# Patient Record
Sex: Male | Born: 1945
Health system: Southern US, Community
[De-identification: ages and names within clinical notes are randomized; demographics above are authoritative.]

## PROBLEM LIST (undated history)

## (undated) HISTORY — PX: CERVICAL DISCECTOMY: SHX98

---

## 1966-01-24 HISTORY — PX: NASAL SEPTUM SURGERY: SHX37

## 1999-04-29 ENCOUNTER — Ambulatory Visit (HOSPITAL_COMMUNITY): Admission: RE | Admit: 1999-04-29 | Discharge: 1999-04-29 | Payer: Self-pay | Admitting: Neurosurgery

## 1999-04-29 ENCOUNTER — Encounter: Payer: Self-pay | Admitting: Neurosurgery

## 1999-05-28 ENCOUNTER — Encounter: Payer: Self-pay | Admitting: Neurosurgery

## 1999-05-28 ENCOUNTER — Ambulatory Visit (HOSPITAL_COMMUNITY): Admission: RE | Admit: 1999-05-28 | Discharge: 1999-05-28 | Payer: Self-pay | Admitting: Neurosurgery

## 1999-07-20 ENCOUNTER — Encounter: Payer: Self-pay | Admitting: Neurosurgery

## 1999-07-20 ENCOUNTER — Ambulatory Visit (HOSPITAL_COMMUNITY): Admission: RE | Admit: 1999-07-20 | Discharge: 1999-07-20 | Payer: Self-pay | Admitting: Neurosurgery

## 2001-05-17 ENCOUNTER — Encounter: Payer: Self-pay | Admitting: Neurosurgery

## 2001-05-17 ENCOUNTER — Encounter: Admission: RE | Admit: 2001-05-17 | Discharge: 2001-05-17 | Payer: Self-pay | Admitting: Neurosurgery

## 2001-05-31 ENCOUNTER — Encounter: Payer: Self-pay | Admitting: Neurosurgery

## 2001-05-31 ENCOUNTER — Encounter: Admission: RE | Admit: 2001-05-31 | Discharge: 2001-05-31 | Payer: Self-pay | Admitting: Neurosurgery

## 2001-06-25 ENCOUNTER — Encounter: Payer: Self-pay | Admitting: Neurosurgery

## 2001-06-25 ENCOUNTER — Encounter: Admission: RE | Admit: 2001-06-25 | Discharge: 2001-06-25 | Payer: Self-pay | Admitting: Neurosurgery

## 2001-09-21 ENCOUNTER — Encounter: Admission: RE | Admit: 2001-09-21 | Discharge: 2001-09-21 | Payer: Self-pay | Admitting: Neurosurgery

## 2001-09-21 ENCOUNTER — Encounter: Payer: Self-pay | Admitting: Neurosurgery

## 2002-04-16 ENCOUNTER — Encounter: Payer: Self-pay | Admitting: Neurosurgery

## 2002-04-17 ENCOUNTER — Inpatient Hospital Stay (HOSPITAL_COMMUNITY): Admission: RE | Admit: 2002-04-17 | Discharge: 2002-04-18 | Payer: Self-pay | Admitting: Neurosurgery

## 2002-04-17 ENCOUNTER — Encounter: Payer: Self-pay | Admitting: Neurosurgery

## 2003-04-17 ENCOUNTER — Ambulatory Visit (HOSPITAL_COMMUNITY): Admission: RE | Admit: 2003-04-17 | Discharge: 2003-04-17 | Payer: Self-pay | Admitting: Gastroenterology

## 2003-10-02 ENCOUNTER — Ambulatory Visit (HOSPITAL_COMMUNITY): Admission: RE | Admit: 2003-10-02 | Discharge: 2003-10-02 | Payer: Self-pay | Admitting: Neurosurgery

## 2011-06-13 ENCOUNTER — Encounter: Payer: Self-pay | Admitting: Gastroenterology

## 2011-06-30 ENCOUNTER — Ambulatory Visit (AMBULATORY_SURGERY_CENTER): Payer: BC Managed Care – PPO | Admitting: *Deleted

## 2011-06-30 ENCOUNTER — Encounter: Payer: Self-pay | Admitting: Gastroenterology

## 2011-06-30 VITALS — Ht 72.0 in | Wt 182.0 lb

## 2011-06-30 DIAGNOSIS — Z1211 Encounter for screening for malignant neoplasm of colon: Secondary | ICD-10-CM

## 2011-06-30 MED ORDER — PEG-KCL-NACL-NASULF-NA ASC-C 100 G PO SOLR: ORAL | Status: DC

## 2011-07-12 ENCOUNTER — Telehealth: Payer: Self-pay | Admitting: Gastroenterology

## 2011-07-12 NOTE — Telephone Encounter (Signed)
yes

## 2011-07-14 ENCOUNTER — Encounter: Payer: Self-pay | Admitting: Gastroenterology

## 2011-08-05 ENCOUNTER — Telehealth: Payer: Self-pay | Admitting: Gastroenterology

## 2011-08-09 ENCOUNTER — Encounter: Payer: BC Managed Care – PPO | Admitting: Gastroenterology

## 2011-08-30 ENCOUNTER — Ambulatory Visit (AMBULATORY_SURGERY_CENTER): Payer: BC Managed Care – PPO | Admitting: Gastroenterology

## 2011-08-30 ENCOUNTER — Encounter: Payer: Self-pay | Admitting: Gastroenterology

## 2011-08-30 VITALS — BP 135/72 | HR 62 | Temp 97.2°F | Resp 17 | Ht 72.0 in | Wt 182.0 lb

## 2011-08-30 DIAGNOSIS — Z1211 Encounter for screening for malignant neoplasm of colon: Secondary | ICD-10-CM

## 2011-08-30 DIAGNOSIS — Z86010 Personal history of colonic polyps: Secondary | ICD-10-CM

## 2011-08-30 DIAGNOSIS — R198 Other specified symptoms and signs involving the digestive system and abdomen: Secondary | ICD-10-CM

## 2011-08-30 DIAGNOSIS — D126 Benign neoplasm of colon, unspecified: Secondary | ICD-10-CM

## 2011-08-30 DIAGNOSIS — K6289 Other specified diseases of anus and rectum: Secondary | ICD-10-CM

## 2011-08-30 MED ORDER — SODIUM CHLORIDE 0.9 % IV SOLN: 500.0000 mL | INTRAVENOUS | Status: DC

## 2011-08-30 NOTE — Patient Instructions (Addendum)
YOU HAD AN ENDOSCOPIC PROCEDURE TODAY AT THE  ENDOSCOPY CENTER: Refer to the procedure report that was given to you for any specific questions about what was found during the examination.  If the procedure report does not answer your questions, please call your gastroenterologist to clarify.  If you requested that your care partner not be given the details of your procedure findings, then the procedure report has been included in a sealed envelope for you to review at your convenience later.  YOU SHOULD EXPECT: Some feelings of bloating in the abdomen. Passage of more gas than usual.  Walking can help get rid of the air that was put into your GI tract during the procedure and reduce the bloating. If you had a lower endoscopy (such as a colonoscopy or flexible sigmoidoscopy) you may notice spotting of blood in your stool or on the toilet paper. If you underwent a bowel prep for your procedure, then you may not have a normal bowel movement for a few days.  DIET: Your first meal following the procedure should be a light meal and then it is ok to progress to your normal diet.  A half-sandwich or bowl of soup is an example of a good first meal.  Heavy or fried foods are harder to digest and may make you feel nauseous or bloated.  Likewise meals heavy in dairy and vegetables can cause extra gas to form and this can also increase the bloating.  Drink plenty of fluids but you should avoid alcoholic beverages for 24 hours.  ACTIVITY: Your care partner should take you home directly after the procedure.  You should plan to take it easy, moving slowly for the rest of the day.  You can resume normal activity the day after the procedure however you should NOT DRIVE or use heavy machinery for 24 hours (because of the sedation medicines used during the test).    SYMPTOMS TO REPORT IMMEDIATELY: A gastroenterologist can be reached at any hour.  During normal business hours, 8:30 AM to 5:00 PM Monday through Friday,  call [phone].  After hours and on weekends, please call the GI answering service at [phone] who will take a message and have the physician on call contact you.   Following lower endoscopy (colonoscopy or flexible sigmoidoscopy):  Excessive amounts of blood in the stool  Significant tenderness or worsening of abdominal pains  Swelling of the abdomen that is new, acute  Fever of 100F or higher  FOLLOW UP: If any biopsies were taken you will be contacted by phone or by letter within the next 1-3 weeks.  Call your gastroenterologist if you have not heard about the biopsies in 3 weeks.  Our staff will call the home number listed on your records the next business day following your procedure to check on you and address any questions or concerns that you may have at that time regarding the information given to you following your procedure. This is a courtesy call and so if there is no answer at the home number and we have not heard from you through the emergency physician on call, we will assume that you have returned to your regular daily activities without incident.  SIGNATURES/CONFIDENTIALITY: You and/or your care partner have signed paperwork which will be entered into your electronic medical record.  These signatures attest to the fact that that the information above on your After Visit Summary has been reviewed and is understood.  Full responsibility of the confidentiality of this   discharge information lies with you and/or your care-partner.   Resume medications. Information given on polyps,diverticulosis,high fiber diet and hemorrhoids with discharge instructions. 

## 2011-08-30 NOTE — Progress Notes (Signed)
Patient did not experience any of the following events: a burn prior to discharge; a fall within the facility; wrong site/side/patient/procedure/implant event; or a hospital transfer or hospital admission upon discharge from the facility. (G8907) Patient did not have preoperative order for IV antibiotic SSI prophylaxis. (G8918)  

## 2011-08-30 NOTE — Op Note (Signed)
[facility] [address] [place], [place]  [address]  COLONOSCOPY PROCEDURE REPORT  PATIENT:  Joseph Moses, Joseph Moses  MR#:  [id number] BIRTHDATE:  10/10/1945, 65 yrs. old  GENDER:  male ENDOSCOPIST:  Barbette Hair. Arlyce Dice, MD REF. BY:  Catha Gosselin, M.D. PROCEDURE DATE:  08/30/2011 PROCEDURE:  Colon with cold biopsy polypectomy, Colonoscopy with biopsy ASA CLASS:  Class I INDICATIONS:  Screening, history of pre-cancerous (adenomatous) colon polyps index polypectomy 2005 MEDICATIONS:   MAC sedation, administered by CRNA propofol 250mg IV  DESCRIPTION OF PROCEDURE:   After the risks benefits and alternatives of the procedure were thoroughly explained, informed consent was obtained.  Digital rectal exam was performed and revealed no abnormalities.   The LB CF-[id number] endoscope was introduced through the anus and advanced to the cecum, which was identified by both the appendix and ileocecal valve, without limitations.  The quality of the prep was excellent, using MoviPrep.  The instrument was then slowly withdrawn as the colon was fully examined. <<PROCEDUREIMAGES>>  FINDINGS:  A sessile polyp was found in the rectum. 3-4cm area of prominent mucosa within 2cm of anus, soft, corresponding to previously described area at 2005 colonoscopy. Bxs taken (see image5).  A sessile polyp was found in the rectum. It was 2 mm in size. It was found 2 cm from the point of entry. The polyp was removed using cold biopsy forceps (see image6).  Mild diverticulosis was found in the sigmoid colon (see image1). Internal Hemorrhoids were found (see image4).  This was otherwise a normal examination of the colon (see image2).   Retroflexed views in the rectum revealed no abnormalities.    The time to cecum = 1) 4.25  minutes. The scope was then withdrawn in  1) 9.0  minutes from the cecum and the procedure completed. COMPLICATIONS:  None ENDOSCOPIC IMPRESSION: 1) Sessile polyp in the rectum 2) 2 mm  sessile polyp in the rectum 3) Mild diverticulosis in the sigmoid colon 4) Internal hemorrhoids 5) Otherwise normal examination RECOMMENDATIONS: 1) Await biopsy results REPEAT EXAM:  You will receive a letter from Dr. Arlyce Dice in 1-2 weeks, after reviewing the final pathology, with followup recommendations.  ______________________________ Barbette Hair Arlyce Dice, MD  CC:  n. eSIGNED:   Barbette Hair. Reign Bartnick at 08/30/2011 03:26 PM  Gaylyn Cheers, [phone]

## 2011-08-31 ENCOUNTER — Telehealth: Payer: Self-pay | Admitting: *Deleted

## 2011-08-31 NOTE — Telephone Encounter (Signed)
Left message that we called for f/u 

## 2011-09-01 ENCOUNTER — Encounter: Payer: Self-pay | Admitting: Gastroenterology

## 2011-09-09 NOTE — Telephone Encounter (Signed)
Austin Miles, Site Mgr, notified.

## 2013-01-31 ENCOUNTER — Ambulatory Visit
Admission: RE | Admit: 2013-01-31 | Discharge: 2013-01-31 | Disposition: A | Discharge status: Home / Self Care | Payer: Medicare Other | Source: Ambulatory Visit | Attending: Family Medicine | Admitting: Family Medicine

## 2013-01-31 ENCOUNTER — Other Ambulatory Visit: Payer: Self-pay | Admitting: Family Medicine

## 2013-01-31 DIAGNOSIS — M542 Cervicalgia: Secondary | ICD-10-CM

## 2013-04-23 ENCOUNTER — Other Ambulatory Visit: Payer: Self-pay | Admitting: Student

## 2013-04-23 ENCOUNTER — Other Ambulatory Visit: Payer: Self-pay | Admitting: Family Medicine

## 2013-04-23 DIAGNOSIS — Z8271 Family history of polycystic kidney: Secondary | ICD-10-CM

## 2013-05-01 ENCOUNTER — Ambulatory Visit
Admission: RE | Admit: 2013-05-01 | Discharge: 2013-05-01 | Disposition: A | Discharge status: Home / Self Care | Payer: Medicare Other | Source: Ambulatory Visit | Attending: Family Medicine | Admitting: Family Medicine

## 2013-05-01 DIAGNOSIS — Z8271 Family history of polycystic kidney: Secondary | ICD-10-CM

## 2014-02-04 DIAGNOSIS — G4733 Obstructive sleep apnea (adult) (pediatric): Secondary | ICD-10-CM | POA: Diagnosis not present

## 2014-03-13 DIAGNOSIS — H6123 Impacted cerumen, bilateral: Secondary | ICD-10-CM | POA: Diagnosis not present

## 2014-03-13 DIAGNOSIS — D126 Benign neoplasm of colon, unspecified: Secondary | ICD-10-CM | POA: Diagnosis not present

## 2014-03-13 DIAGNOSIS — Z0000 Encounter for general adult medical examination without abnormal findings: Secondary | ICD-10-CM | POA: Diagnosis not present

## 2014-03-13 DIAGNOSIS — Z79899 Other long term (current) drug therapy: Secondary | ICD-10-CM | POA: Diagnosis not present

## 2014-03-13 DIAGNOSIS — M479 Spondylosis, unspecified: Secondary | ICD-10-CM | POA: Diagnosis not present

## 2014-03-13 DIAGNOSIS — G4733 Obstructive sleep apnea (adult) (pediatric): Secondary | ICD-10-CM | POA: Diagnosis not present

## 2014-03-13 DIAGNOSIS — N400 Enlarged prostate without lower urinary tract symptoms: Secondary | ICD-10-CM | POA: Diagnosis not present

## 2014-03-13 DIAGNOSIS — Z136 Encounter for screening for cardiovascular disorders: Secondary | ICD-10-CM | POA: Diagnosis not present

## 2014-03-13 DIAGNOSIS — Z125 Encounter for screening for malignant neoplasm of prostate: Secondary | ICD-10-CM | POA: Diagnosis not present

## 2014-03-13 DIAGNOSIS — M1990 Unspecified osteoarthritis, unspecified site: Secondary | ICD-10-CM | POA: Diagnosis not present

## 2014-10-24 DIAGNOSIS — Z23 Encounter for immunization: Secondary | ICD-10-CM | POA: Diagnosis not present

## 2015-03-24 DIAGNOSIS — Z0000 Encounter for general adult medical examination without abnormal findings: Secondary | ICD-10-CM | POA: Diagnosis not present

## 2015-03-24 DIAGNOSIS — Z136 Encounter for screening for cardiovascular disorders: Secondary | ICD-10-CM | POA: Diagnosis not present

## 2015-03-24 DIAGNOSIS — Z125 Encounter for screening for malignant neoplasm of prostate: Secondary | ICD-10-CM | POA: Diagnosis not present

## 2015-03-26 DIAGNOSIS — D126 Benign neoplasm of colon, unspecified: Secondary | ICD-10-CM | POA: Diagnosis not present

## 2015-03-26 DIAGNOSIS — H6120 Impacted cerumen, unspecified ear: Secondary | ICD-10-CM | POA: Diagnosis not present

## 2015-03-26 DIAGNOSIS — G4733 Obstructive sleep apnea (adult) (pediatric): Secondary | ICD-10-CM | POA: Diagnosis not present

## 2015-03-26 DIAGNOSIS — Z79899 Other long term (current) drug therapy: Secondary | ICD-10-CM | POA: Diagnosis not present

## 2015-03-26 DIAGNOSIS — Z125 Encounter for screening for malignant neoplasm of prostate: Secondary | ICD-10-CM | POA: Diagnosis not present

## 2015-03-26 DIAGNOSIS — Z0000 Encounter for general adult medical examination without abnormal findings: Secondary | ICD-10-CM | POA: Diagnosis not present

## 2015-10-21 DIAGNOSIS — Z23 Encounter for immunization: Secondary | ICD-10-CM | POA: Diagnosis not present

## 2017-02-01 DIAGNOSIS — Z0100 Encounter for examination of eyes and vision without abnormal findings: Secondary | ICD-10-CM | POA: Diagnosis not present

## 2017-02-28 DIAGNOSIS — R69 Illness, unspecified: Secondary | ICD-10-CM | POA: Diagnosis not present

## 2017-03-22 DIAGNOSIS — H25811 Combined forms of age-related cataract, right eye: Secondary | ICD-10-CM | POA: Diagnosis not present

## 2017-03-22 DIAGNOSIS — H2511 Age-related nuclear cataract, right eye: Secondary | ICD-10-CM | POA: Diagnosis not present

## 2017-04-25 DIAGNOSIS — Z125 Encounter for screening for malignant neoplasm of prostate: Secondary | ICD-10-CM | POA: Diagnosis not present

## 2017-04-25 DIAGNOSIS — Z79899 Other long term (current) drug therapy: Secondary | ICD-10-CM | POA: Diagnosis not present

## 2017-04-26 DIAGNOSIS — R7301 Impaired fasting glucose: Secondary | ICD-10-CM | POA: Diagnosis not present

## 2017-04-26 DIAGNOSIS — R972 Elevated prostate specific antigen [PSA]: Secondary | ICD-10-CM | POA: Diagnosis not present

## 2017-04-26 DIAGNOSIS — Z7189 Other specified counseling: Secondary | ICD-10-CM | POA: Diagnosis not present

## 2017-04-26 DIAGNOSIS — Z1389 Encounter for screening for other disorder: Secondary | ICD-10-CM | POA: Diagnosis not present

## 2017-04-26 DIAGNOSIS — G4733 Obstructive sleep apnea (adult) (pediatric): Secondary | ICD-10-CM | POA: Diagnosis not present

## 2017-04-26 DIAGNOSIS — Z79899 Other long term (current) drug therapy: Secondary | ICD-10-CM | POA: Diagnosis not present

## 2017-04-26 DIAGNOSIS — Z86010 Personal history of colonic polyps: Secondary | ICD-10-CM | POA: Diagnosis not present

## 2017-04-26 DIAGNOSIS — M479 Spondylosis, unspecified: Secondary | ICD-10-CM | POA: Diagnosis not present

## 2017-04-26 DIAGNOSIS — Z0000 Encounter for general adult medical examination without abnormal findings: Secondary | ICD-10-CM | POA: Diagnosis not present

## 2017-09-28 DIAGNOSIS — R972 Elevated prostate specific antigen [PSA]: Secondary | ICD-10-CM | POA: Diagnosis not present

## 2017-09-29 DIAGNOSIS — R972 Elevated prostate specific antigen [PSA]: Secondary | ICD-10-CM | POA: Diagnosis not present

## 2017-10-16 DIAGNOSIS — R69 Illness, unspecified: Secondary | ICD-10-CM | POA: Diagnosis not present

## 2017-10-26 DIAGNOSIS — R972 Elevated prostate specific antigen [PSA]: Secondary | ICD-10-CM | POA: Diagnosis not present

## 2017-11-09 DIAGNOSIS — Z961 Presence of intraocular lens: Secondary | ICD-10-CM | POA: Diagnosis not present

## 2018-06-07 DIAGNOSIS — Z79899 Other long term (current) drug therapy: Secondary | ICD-10-CM | POA: Diagnosis not present

## 2018-06-07 DIAGNOSIS — M1990 Unspecified osteoarthritis, unspecified site: Secondary | ICD-10-CM | POA: Diagnosis not present

## 2018-06-07 DIAGNOSIS — Z86010 Personal history of colonic polyps: Secondary | ICD-10-CM | POA: Diagnosis not present

## 2018-06-07 DIAGNOSIS — R69 Illness, unspecified: Secondary | ICD-10-CM | POA: Diagnosis not present

## 2018-06-07 DIAGNOSIS — G4733 Obstructive sleep apnea (adult) (pediatric): Secondary | ICD-10-CM | POA: Diagnosis not present

## 2018-06-07 DIAGNOSIS — Z0000 Encounter for general adult medical examination without abnormal findings: Secondary | ICD-10-CM | POA: Diagnosis not present

## 2018-06-07 DIAGNOSIS — E785 Hyperlipidemia, unspecified: Secondary | ICD-10-CM | POA: Diagnosis not present

## 2018-06-07 DIAGNOSIS — R7301 Impaired fasting glucose: Secondary | ICD-10-CM | POA: Diagnosis not present

## 2018-06-07 DIAGNOSIS — R972 Elevated prostate specific antigen [PSA]: Secondary | ICD-10-CM | POA: Diagnosis not present

## 2018-06-14 DIAGNOSIS — Z0000 Encounter for general adult medical examination without abnormal findings: Secondary | ICD-10-CM | POA: Diagnosis not present

## 2018-06-14 DIAGNOSIS — G4733 Obstructive sleep apnea (adult) (pediatric): Secondary | ICD-10-CM | POA: Diagnosis not present

## 2018-06-14 DIAGNOSIS — E785 Hyperlipidemia, unspecified: Secondary | ICD-10-CM | POA: Diagnosis not present

## 2018-06-14 DIAGNOSIS — M1990 Unspecified osteoarthritis, unspecified site: Secondary | ICD-10-CM | POA: Diagnosis not present

## 2018-06-14 DIAGNOSIS — R7301 Impaired fasting glucose: Secondary | ICD-10-CM | POA: Diagnosis not present

## 2018-06-14 DIAGNOSIS — R972 Elevated prostate specific antigen [PSA]: Secondary | ICD-10-CM | POA: Diagnosis not present

## 2018-06-14 DIAGNOSIS — Z79899 Other long term (current) drug therapy: Secondary | ICD-10-CM | POA: Diagnosis not present

## 2018-06-14 DIAGNOSIS — Z86010 Personal history of colonic polyps: Secondary | ICD-10-CM | POA: Diagnosis not present

## 2018-06-19 DIAGNOSIS — G4733 Obstructive sleep apnea (adult) (pediatric): Secondary | ICD-10-CM | POA: Diagnosis not present

## 2018-06-28 DIAGNOSIS — G4733 Obstructive sleep apnea (adult) (pediatric): Secondary | ICD-10-CM | POA: Diagnosis not present

## 2018-07-17 DIAGNOSIS — H52223 Regular astigmatism, bilateral: Secondary | ICD-10-CM | POA: Diagnosis not present

## 2018-07-17 DIAGNOSIS — Z961 Presence of intraocular lens: Secondary | ICD-10-CM | POA: Diagnosis not present

## 2018-07-17 DIAGNOSIS — H5203 Hypermetropia, bilateral: Secondary | ICD-10-CM | POA: Diagnosis not present

## 2018-07-17 DIAGNOSIS — H524 Presbyopia: Secondary | ICD-10-CM | POA: Diagnosis not present

## 2018-07-28 DIAGNOSIS — G4733 Obstructive sleep apnea (adult) (pediatric): Secondary | ICD-10-CM | POA: Diagnosis not present

## 2018-08-14 DIAGNOSIS — H10021 Other mucopurulent conjunctivitis, right eye: Secondary | ICD-10-CM | POA: Diagnosis not present

## 2018-08-28 DIAGNOSIS — G4733 Obstructive sleep apnea (adult) (pediatric): Secondary | ICD-10-CM | POA: Diagnosis not present

## 2018-09-26 DIAGNOSIS — G4733 Obstructive sleep apnea (adult) (pediatric): Secondary | ICD-10-CM | POA: Diagnosis not present

## 2018-09-28 DIAGNOSIS — G4733 Obstructive sleep apnea (adult) (pediatric): Secondary | ICD-10-CM | POA: Diagnosis not present

## 2018-10-10 DIAGNOSIS — R69 Illness, unspecified: Secondary | ICD-10-CM | POA: Diagnosis not present

## 2018-10-28 DIAGNOSIS — G4733 Obstructive sleep apnea (adult) (pediatric): Secondary | ICD-10-CM | POA: Diagnosis not present

## 2018-11-13 DIAGNOSIS — H26493 Other secondary cataract, bilateral: Secondary | ICD-10-CM | POA: Diagnosis not present

## 2018-11-13 DIAGNOSIS — H524 Presbyopia: Secondary | ICD-10-CM | POA: Diagnosis not present

## 2018-11-28 DIAGNOSIS — G4733 Obstructive sleep apnea (adult) (pediatric): Secondary | ICD-10-CM | POA: Diagnosis not present

## 2018-11-29 DIAGNOSIS — R300 Dysuria: Secondary | ICD-10-CM | POA: Diagnosis not present

## 2018-12-28 DIAGNOSIS — G4733 Obstructive sleep apnea (adult) (pediatric): Secondary | ICD-10-CM | POA: Diagnosis not present

## 2019-01-23 DIAGNOSIS — R230 Cyanosis: Secondary | ICD-10-CM | POA: Diagnosis not present

## 2019-01-24 ENCOUNTER — Other Ambulatory Visit (HOSPITAL_COMMUNITY): Payer: Self-pay | Admitting: Family Medicine

## 2019-01-24 DIAGNOSIS — R230 Cyanosis: Secondary | ICD-10-CM

## 2019-01-28 DIAGNOSIS — G4733 Obstructive sleep apnea (adult) (pediatric): Secondary | ICD-10-CM | POA: Diagnosis not present

## 2019-01-31 ENCOUNTER — Other Ambulatory Visit: Payer: Self-pay

## 2019-01-31 ENCOUNTER — Ambulatory Visit (HOSPITAL_COMMUNITY): Payer: Medicare HMO | Attending: Cardiology

## 2019-01-31 DIAGNOSIS — G4730 Sleep apnea, unspecified: Secondary | ICD-10-CM | POA: Insufficient documentation

## 2019-01-31 DIAGNOSIS — R230 Cyanosis: Secondary | ICD-10-CM | POA: Diagnosis present

## 2019-01-31 DIAGNOSIS — I358 Other nonrheumatic aortic valve disorders: Secondary | ICD-10-CM | POA: Diagnosis not present

## 2019-02-01 ENCOUNTER — Ambulatory Visit (INDEPENDENT_AMBULATORY_CARE_PROVIDER_SITE_OTHER): Payer: Medicare HMO | Admitting: Vascular Surgery

## 2019-02-01 ENCOUNTER — Ambulatory Visit (HOSPITAL_COMMUNITY)
Admission: RE | Admit: 2019-02-01 | Discharge: 2019-02-01 | Disposition: A | Discharge status: Home / Self Care | Payer: Medicare HMO | Source: Ambulatory Visit | Attending: Vascular Surgery | Admitting: Vascular Surgery

## 2019-02-01 ENCOUNTER — Encounter: Payer: Self-pay | Admitting: Vascular Surgery

## 2019-02-01 ENCOUNTER — Other Ambulatory Visit (HOSPITAL_COMMUNITY): Payer: Self-pay | Admitting: Vascular Surgery

## 2019-02-01 VITALS — BP 129/78 | HR 55 | Temp 98.2°F | Resp 20 | Ht 72.0 in | Wt 176.0 lb

## 2019-02-01 DIAGNOSIS — R230 Cyanosis: Secondary | ICD-10-CM | POA: Diagnosis not present

## 2019-02-01 DIAGNOSIS — L819 Disorder of pigmentation, unspecified: Secondary | ICD-10-CM

## 2019-02-01 NOTE — Progress Notes (Signed)
Patient ID: Joseph Moses, male   DOB: 11/07/1945, 74 y.o.   MRN: [id number]  Reason for Consult: New Patient (Initial Visit)   Referred by Hulan Fess, MD  Subjective:     HPI:  Joseph Moses is a 74 y.o. male without any significant past medical history.  States he has takes no medicines.  Has never smoked a cigarette.  Is not diabetic.  Denies any hypertension.  Has never had lower extremity surgery.  States for a few weeks he has had a few toes on his left foot which have appeared purple.  He denies any pain.  Does have some tingling there but otherwise asymptomatic.  No tissue loss or ulceration.  Is never had issues like this before.  Denies any family history of aneurysm.  Has never had a stroke.  History reviewed. No pertinent past medical history. History reviewed. No pertinent family history. Past Surgical History:  Procedure Laterality Date  . CERVICAL DISCECTOMY  2001 &2005  . NASAL SEPTUM SURGERY  1968    Short Social History:  Social History   Tobacco Use  . Smoking status: Never Smoker  . Smokeless tobacco: Never Used  Substance Use Topics  . Alcohol use: Yes    Alcohol/week: 1.0 standard drinks    Types: 1 Cans of beer per week    No Known Allergies  No current outpatient medications on file.   No current facility-administered medications for this visit.    Review of Systems  Constitutional:  Constitutional negative. HENT: HENT negative.  Eyes: Eyes negative.  Respiratory: Respiratory negative.  Cardiovascular: Cardiovascular negative.  GI: Gastrointestinal negative.  Musculoskeletal:       Purple toes Skin: Skin negative.  Neurological: Neurological negative. Hematologic: Hematologic/lymphatic negative.  Psychiatric: Psychiatric negative.        Objective:   Vitals:   02/01/19 1323  BP: 129/78  Pulse: (!) 55  Resp: 20  Temp: 98.2 F (36.8 C)  SpO2: 96%    Physical Exam HENT:     Head: Normocephalic.     Nose: Nose normal.    Eyes:     Pupils: Pupils are equal, round, and reactive to light.  Cardiovascular:     Rate and Rhythm: Normal rate.     Pulses:          Carotid pulses are 2+ on the right side and 2+ on the left side.      Femoral pulses are 2+ on the right side and 2+ on the left side.      Popliteal pulses are 2+ on the right side and 2+ on the left side.       Posterior tibial pulses are 2+ on the right side and 2+ on the left side.  Pulmonary:     Effort: Pulmonary effort is normal.  Abdominal:     General: Abdomen is flat.     Palpations: Abdomen is soft. There is no mass.  Musculoskeletal:        General: No swelling.     Comments: There is purple discoloration of his right third toe and left second and fourth toes  Skin:    General: Skin is warm and dry.     Capillary Refill: Capillary refill is sluggish and affected toes otherwise is normal Neurological:     General: No focal deficit present.     Mental Status: He is alert.  Psychiatric:        Mood and Affect: Mood normal.  Thought Content: Thought content normal.        Judgment: Judgment normal.     Data: I have independently interpreted his ABIs to be 1.2 bilaterally with triphasic waveforms.     Assessment/Plan:     74 year old male without any significant past medical history presents with 1 toe on the right that is purple and 2 on the left that are purple and this is relatively new onset.  Having no previous history of this with normal ABIs palpable pulses throughout I have recommended CT angio of his chest abdomen pelvis with bilateral runoff to evaluate for any embolizing lesions.  Patient will start 81 mg aspirin.  He will follow-up with me after the CT scan.  Possibility will need endovascular invention versus anticoagulation and this was discussed with him today and he demonstrates good understanding.  Any worsening symptoms we need to be notified urgently.     Waynetta Sandy MD [facility]

## 2019-02-05 ENCOUNTER — Other Ambulatory Visit: Payer: Self-pay

## 2019-02-05 DIAGNOSIS — I743 Embolism and thrombosis of arteries of the lower extremities: Secondary | ICD-10-CM

## 2019-02-11 ENCOUNTER — Other Ambulatory Visit: Payer: Self-pay

## 2019-02-11 DIAGNOSIS — M79606 Pain in leg, unspecified: Secondary | ICD-10-CM

## 2019-02-15 ENCOUNTER — Other Ambulatory Visit: Payer: Self-pay

## 2019-02-15 ENCOUNTER — Other Ambulatory Visit: Payer: Self-pay | Admitting: Vascular Surgery

## 2019-02-15 ENCOUNTER — Ambulatory Visit: Payer: Medicare HMO | Admitting: Vascular Surgery

## 2019-02-15 DIAGNOSIS — I743 Embolism and thrombosis of arteries of the lower extremities: Secondary | ICD-10-CM

## 2019-02-15 DIAGNOSIS — I75029 Atheroembolism of unspecified lower extremity: Secondary | ICD-10-CM

## 2019-02-18 ENCOUNTER — Ambulatory Visit (HOSPITAL_COMMUNITY)
Admission: RE | Admit: 2019-02-18 | Discharge: 2019-02-18 | Disposition: A | Discharge status: Home / Self Care | Payer: Medicare HMO | Source: Ambulatory Visit | Attending: Vascular Surgery | Admitting: Vascular Surgery

## 2019-02-18 ENCOUNTER — Encounter (HOSPITAL_COMMUNITY): Payer: Self-pay

## 2019-02-18 ENCOUNTER — Other Ambulatory Visit: Payer: Medicare HMO

## 2019-02-18 ENCOUNTER — Other Ambulatory Visit: Payer: Self-pay

## 2019-02-18 ENCOUNTER — Other Ambulatory Visit (HOSPITAL_COMMUNITY): Payer: Medicare HMO

## 2019-02-18 ENCOUNTER — Ambulatory Visit (HOSPITAL_COMMUNITY): Admission: RE | Admit: 2019-02-18 | Payer: Medicare HMO | Source: Ambulatory Visit

## 2019-02-18 DIAGNOSIS — L819 Disorder of pigmentation, unspecified: Secondary | ICD-10-CM | POA: Diagnosis not present

## 2019-02-18 DIAGNOSIS — K5730 Diverticulosis of large intestine without perforation or abscess without bleeding: Secondary | ICD-10-CM | POA: Diagnosis not present

## 2019-02-18 DIAGNOSIS — I743 Embolism and thrombosis of arteries of the lower extremities: Secondary | ICD-10-CM

## 2019-02-18 DIAGNOSIS — I70203 Unspecified atherosclerosis of native arteries of extremities, bilateral legs: Secondary | ICD-10-CM | POA: Diagnosis not present

## 2019-02-18 MED ORDER — IOHEXOL 350 MG/ML SOLN
100.0000 mL | Freq: Once | INTRAVENOUS | Status: AC | PRN
  Administered 2019-02-18: 100 mL via INTRAVENOUS

## 2019-02-18 MED ORDER — SODIUM CHLORIDE (PF) 0.9 % IJ SOLN
INTRAMUSCULAR | Status: AC
  Filled 2019-02-18: qty 100

## 2019-02-21 ENCOUNTER — Inpatient Hospital Stay: Admission: RE | Admit: 2019-02-21 | Payer: Medicare HMO | Source: Ambulatory Visit

## 2019-02-21 ENCOUNTER — Other Ambulatory Visit: Payer: Medicare HMO

## 2019-02-28 ENCOUNTER — Encounter (HOSPITAL_COMMUNITY): Payer: Medicare HMO

## 2019-02-28 ENCOUNTER — Encounter: Payer: Medicare HMO | Admitting: Vascular Surgery

## 2019-02-28 DIAGNOSIS — G4733 Obstructive sleep apnea (adult) (pediatric): Secondary | ICD-10-CM | POA: Diagnosis not present

## 2019-03-01 ENCOUNTER — Encounter: Payer: Self-pay | Admitting: Vascular Surgery

## 2019-03-01 ENCOUNTER — Ambulatory Visit (INDEPENDENT_AMBULATORY_CARE_PROVIDER_SITE_OTHER): Payer: Medicare HMO | Admitting: Vascular Surgery

## 2019-03-01 ENCOUNTER — Other Ambulatory Visit: Payer: Self-pay

## 2019-03-01 VITALS — BP 140/76 | HR 59 | Temp 97.6°F | Resp 20 | Ht 72.0 in | Wt 179.8 lb

## 2019-03-01 DIAGNOSIS — R230 Cyanosis: Secondary | ICD-10-CM

## 2019-03-01 NOTE — Progress Notes (Signed)
   Patient ID: Joseph Moses, male   DOB: 1945/12/18, 74 y.o.   MRN: [id number]  Reason for Consult: Follow-up   Referred by Hulan Fess, MD  Subjective:     HPI:  Joseph Moses is a 74 y.o. male who is very healthy without significant past medical history.  I saw him few weeks back for toe discoloration on his left foot.  Without any issues we started aspirin he was sent for CT scan which he follows up to evaluate the results today.  History reviewed. No pertinent past medical history. History reviewed. No pertinent family history. Past Surgical History:  Procedure Laterality Date  . CERVICAL DISCECTOMY  2001 &2005  . NASAL SEPTUM SURGERY  1968    Short Social History:  Social History   Tobacco Use  . Smoking status: Never Smoker  . Smokeless tobacco: Never Used  Substance Use Topics  . Alcohol use: Yes    Alcohol/week: 1.0 standard drinks    Types: 1 Cans of beer per week    No Known Allergies  Current Outpatient Medications  Medication Sig Dispense Refill  . aspirin EC 81 MG tablet Take 81 mg by mouth daily.     No current facility-administered medications for this visit.    Review of Systems  Constitutional:  Constitutional negative. HENT: HENT negative.  Eyes: Eyes negative.  Respiratory: Respiratory negative.  Cardiovascular: Cardiovascular negative.  GI: Gastrointestinal negative.  Musculoskeletal: Musculoskeletal negative.  Skin:       Purple for second and third toe on the left Neurological: Neurological negative. Hematologic: Hematologic/lymphatic negative.  Psychiatric: Psychiatric negative.        Objective:  Objective   Vitals:   03/01/19 0847  BP: 140/76  Pulse: (!) 59  Resp: 20  Temp: 97.6 F (36.4 C)  SpO2: 100%  Weight: 179 lb 12.8 oz (81.6 kg)  Height: 6' (1.829 m)   Body mass index is 24.39 kg/m.  Physical Exam HENT:     Head: Normocephalic.     Nose:     Comments: Mask in place Neck:     Vascular: No carotid  bruit.  Cardiovascular:     Pulses: Normal pulses.  Abdominal:     General: Abdomen is flat.     Palpations: Abdomen is soft. There is no mass.  Musculoskeletal:        General: Normal range of motion.     Cervical back: Normal range of motion and neck supple.  Skin:    General: Skin is warm and dry.  Neurological:     General: No focal deficit present.  Psychiatric:        Mood and Affect: Mood normal.        Thought Content: Thought content normal.        Judgment: Judgment normal.     Data: We reviewed his CT chest abdomen pelvis as well as as well as lower extremity runoff did not demonstrate any evidence of concern for embolism.     Assessment/Plan:     74 year old male with purple discoloration left toes.  CT scan was essentially normal we reviewed this together today.  He can follow-up on an as-needed basis.     Waynetta Sandy MD [facility]

## 2019-03-21 DIAGNOSIS — Z0100 Encounter for examination of eyes and vision without abnormal findings: Secondary | ICD-10-CM | POA: Diagnosis not present

## 2019-03-21 DIAGNOSIS — R7989 Other specified abnormal findings of blood chemistry: Secondary | ICD-10-CM | POA: Diagnosis not present

## 2019-03-21 DIAGNOSIS — Z79899 Other long term (current) drug therapy: Secondary | ICD-10-CM | POA: Diagnosis not present

## 2019-03-28 DIAGNOSIS — G4733 Obstructive sleep apnea (adult) (pediatric): Secondary | ICD-10-CM | POA: Diagnosis not present

## 2019-07-23 DIAGNOSIS — H26493 Other secondary cataract, bilateral: Secondary | ICD-10-CM | POA: Diagnosis not present

## 2019-08-16 DIAGNOSIS — M5441 Lumbago with sciatica, right side: Secondary | ICD-10-CM | POA: Diagnosis not present

## 2019-10-07 DIAGNOSIS — G4733 Obstructive sleep apnea (adult) (pediatric): Secondary | ICD-10-CM | POA: Diagnosis not present

## 2019-10-09 DIAGNOSIS — R69 Illness, unspecified: Secondary | ICD-10-CM | POA: Diagnosis not present

## 2019-12-10 DIAGNOSIS — Z1389 Encounter for screening for other disorder: Secondary | ICD-10-CM | POA: Diagnosis not present

## 2019-12-10 DIAGNOSIS — Z0000 Encounter for general adult medical examination without abnormal findings: Secondary | ICD-10-CM | POA: Diagnosis not present

## 2020-01-01 DIAGNOSIS — G4733 Obstructive sleep apnea (adult) (pediatric): Secondary | ICD-10-CM | POA: Diagnosis not present

## 2020-01-01 DIAGNOSIS — Z79899 Other long term (current) drug therapy: Secondary | ICD-10-CM | POA: Diagnosis not present

## 2020-01-01 DIAGNOSIS — E785 Hyperlipidemia, unspecified: Secondary | ICD-10-CM | POA: Diagnosis not present

## 2020-01-01 DIAGNOSIS — R69 Illness, unspecified: Secondary | ICD-10-CM | POA: Diagnosis not present

## 2020-01-01 DIAGNOSIS — R972 Elevated prostate specific antigen [PSA]: Secondary | ICD-10-CM | POA: Diagnosis not present

## 2020-01-01 DIAGNOSIS — Z86010 Personal history of colonic polyps: Secondary | ICD-10-CM | POA: Diagnosis not present

## 2020-01-01 DIAGNOSIS — R7301 Impaired fasting glucose: Secondary | ICD-10-CM | POA: Diagnosis not present

## 2020-01-22 DIAGNOSIS — R972 Elevated prostate specific antigen [PSA]: Secondary | ICD-10-CM | POA: Diagnosis not present

## 2020-01-22 DIAGNOSIS — N400 Enlarged prostate without lower urinary tract symptoms: Secondary | ICD-10-CM | POA: Diagnosis not present

## 2020-05-06 ENCOUNTER — Ambulatory Visit: Payer: Medicare HMO | Admitting: Physician Assistant

## 2020-05-06 ENCOUNTER — Encounter: Payer: Self-pay | Admitting: Physician Assistant

## 2020-05-06 ENCOUNTER — Other Ambulatory Visit: Payer: Self-pay

## 2020-05-06 DIAGNOSIS — D1800 Hemangioma unspecified site: Secondary | ICD-10-CM | POA: Diagnosis not present

## 2020-05-06 DIAGNOSIS — L814 Other melanin hyperpigmentation: Secondary | ICD-10-CM | POA: Diagnosis not present

## 2020-05-06 DIAGNOSIS — L578 Other skin changes due to chronic exposure to nonionizing radiation: Secondary | ICD-10-CM

## 2020-05-06 DIAGNOSIS — L821 Other seborrheic keratosis: Secondary | ICD-10-CM

## 2020-05-06 DIAGNOSIS — Z1283 Encounter for screening for malignant neoplasm of skin: Secondary | ICD-10-CM

## 2020-05-06 DIAGNOSIS — L219 Seborrheic dermatitis, unspecified: Secondary | ICD-10-CM | POA: Diagnosis not present

## 2020-05-06 NOTE — Patient Instructions (Signed)
Nizoral Anti-Dandruff Shampoo over the counter for scalp

## 2020-05-06 NOTE — Progress Notes (Signed)
   New Patient   Subjective  Joseph Moses is a 75 y.o. male who presents for the following: Annual Exam (No real concerns, previous dermatology [facility] no removals. No skin cancers found today or atypical moles.).   The following portions of the chart were reviewed this encounter and updated as appropriate:  Tobacco  Allergies  Meds  Problems  Med Hx  Surg Hx  Fam Hx      Objective  Well appearing patient in no apparent distress; mood and affect are within normal limits.  All skin waist up examined.  Objective  Scalp: Scalp flakes with normal.    Assessment & Plan  Seborrheic dermatitis Scalp  Nizoral Anti-Dandruff Shampoo over the counter, try the otc the we can proceed to RX ketoconazole by phone  Lentigines - Scattered tan macules - Discussed due to sun exposure - Benign, observe - Call for any changes  Seborrheic Keratoses - Stuck-on, waxy, tan-brown papules and plaques  - Discussed benign etiology and prognosis. - Observe - Call for any changes  Hemangiomas - Red papules - Discussed benign nature - Observe - Call for any changes  Actinic Damage - diffuse scaly erythematous macules with underlying dyspigmentation - Recommend daily broad spectrum sunscreen SPF 30+ to sun-exposed areas, reapply every 2 hours as needed.  - Call for new or changing lesions.  Skin cancer screening performed today.     I, Geza Beranek, PA-C, have reviewed all documentation for this visit. The documentation on 05/06/20 for the exam, diagnosis, procedures, and orders are all accurate and complete.

## 2020-07-15 DIAGNOSIS — R972 Elevated prostate specific antigen [PSA]: Secondary | ICD-10-CM | POA: Diagnosis not present

## 2020-07-21 DIAGNOSIS — N400 Enlarged prostate without lower urinary tract symptoms: Secondary | ICD-10-CM | POA: Diagnosis not present

## 2020-07-21 DIAGNOSIS — R972 Elevated prostate specific antigen [PSA]: Secondary | ICD-10-CM | POA: Diagnosis not present

## 2020-07-30 DIAGNOSIS — Z6824 Body mass index (BMI) 24.0-24.9, adult: Secondary | ICD-10-CM | POA: Diagnosis not present

## 2020-07-30 DIAGNOSIS — H6120 Impacted cerumen, unspecified ear: Secondary | ICD-10-CM | POA: Diagnosis not present

## 2020-07-31 ENCOUNTER — Encounter (INDEPENDENT_AMBULATORY_CARE_PROVIDER_SITE_OTHER): Payer: Self-pay

## 2020-07-31 DIAGNOSIS — H26493 Other secondary cataract, bilateral: Secondary | ICD-10-CM | POA: Diagnosis not present

## 2020-07-31 DIAGNOSIS — H353131 Nonexudative age-related macular degeneration, bilateral, early dry stage: Secondary | ICD-10-CM | POA: Diagnosis not present

## 2020-08-24 ENCOUNTER — Encounter (INDEPENDENT_AMBULATORY_CARE_PROVIDER_SITE_OTHER): Payer: Self-pay | Admitting: Ophthalmology

## 2020-08-24 ENCOUNTER — Other Ambulatory Visit: Payer: Self-pay

## 2020-08-24 ENCOUNTER — Ambulatory Visit (INDEPENDENT_AMBULATORY_CARE_PROVIDER_SITE_OTHER): Payer: Medicare HMO | Admitting: Ophthalmology

## 2020-08-24 DIAGNOSIS — H353132 Nonexudative age-related macular degeneration, bilateral, intermediate dry stage: Secondary | ICD-10-CM

## 2020-08-24 DIAGNOSIS — H3554 Dystrophies primarily involving the retinal pigment epithelium: Secondary | ICD-10-CM

## 2020-08-24 DIAGNOSIS — Z9989 Dependence on other enabling machines and devices: Secondary | ICD-10-CM

## 2020-08-24 DIAGNOSIS — H353221 Exudative age-related macular degeneration, left eye, with active choroidal neovascularization: Secondary | ICD-10-CM

## 2020-08-24 DIAGNOSIS — G4733 Obstructive sleep apnea (adult) (pediatric): Secondary | ICD-10-CM | POA: Insufficient documentation

## 2020-08-24 MED ORDER — BEVACIZUMAB 2.5 MG/0.1ML IZ SOSY
2.5000 mg | PREFILLED_SYRINGE | INTRAVITREAL | Status: AC | PRN
  Administered 2020-08-24: 2.5 mg via INTRAVITREAL

## 2020-08-24 NOTE — Progress Notes (Signed)
08/24/2020     CHIEF COMPLAINT Patient presents for Retina Evaluation (New large central drusenoid changes OU. /Patient states vision is stable and unchanged since last visit. Denies any new floaters or FOL. /Patient was referred by Dr. Valetta Close for drusen changes.//)   HISTORY OF PRESENT ILLNESS: Joseph Moses is a 75 y.o. male who presents to the clinic today for:   HPI     Retina Evaluation           Laterality: both eyes   Onset: 3 weeks ago   Duration: 3 weeks   Associated Symptoms: Negative for Flashes, Floaters and Distortion   Comments: New large central drusenoid changes OU.  Patient states vision is stable and unchanged since last visit. Denies any new floaters or FOL.  Patient was referred by Dr. Valetta Close for drusen changes.         Last edited by Laurin Coder, COA on 08/24/2020  3:21 PM.      Referring physician: Hulan Fess, MD [place],  [place] [id number]  HISTORICAL INFORMATION:   Selected notes from the MEDICAL RECORD NUMBER       CURRENT MEDICATIONS: No current outpatient medications on file. (Ophthalmic Drugs)   No current facility-administered medications for this visit. (Ophthalmic Drugs)   Current Outpatient Medications (Other)  Medication Sig   aspirin EC 81 MG tablet Take 81 mg by mouth daily.   No current facility-administered medications for this visit. (Other)      REVIEW OF SYSTEMS:    ALLERGIES No Known Allergies  PAST MEDICAL HISTORY History reviewed. No pertinent past medical history. Past Surgical History:  Procedure Laterality Date   CERVICAL DISCECTOMY  2001 &2005   NASAL SEPTUM SURGERY  1968    FAMILY HISTORY History reviewed. No pertinent family history.  SOCIAL HISTORY Social History   Tobacco Use   Smoking status: Never   Smokeless tobacco: Never  Vaping Use   Vaping Use: Never used  Substance Use Topics   Alcohol use: Yes    Alcohol/week: 1.0 standard drink    Types: 1 Cans of  beer per week   Drug use: No         OPHTHALMIC EXAM:  Base Eye Exam     Visual Acuity (ETDRS)       Right Left   Dist cc 20/30 -2 20/100 -1   Dist ph cc NI 20/60 -2         Tonometry (Tonopen, 3:25 PM)       Right Left   Pressure 13 13         Pupils       Pupils Dark Light Shape React APD   Right PERRL 5 4 Round Brisk None   Left PERRL 5 4 Round Brisk None         Visual Fields (Counting fingers)       Left Right    Full Full         Extraocular Movement       Right Left    Full Full         Neuro/Psych     Oriented x3: Yes   Mood/Affect: Normal         Dilation     Both eyes: 1.0% Mydriacyl, 2.5% Phenylephrine @ 3:25 PM           Slit Lamp and Fundus Exam     External Exam       Right Left  External Normal Normal         Slit Lamp Exam       Right Left   Lids/Lashes Normal Normal   Conjunctiva/Sclera White and quiet White and quiet   Cornea Clear Clear   Anterior Chamber Deep and quiet Deep and quiet   Iris Round and reactive Round and reactive   Lens Posterior chamber intraocular lens, trace PCO Posterior chamber intraocular lens, 2+ Posterior capsular opacification   Anterior Vitreous Normal Normal         Fundus Exam       Right Left   Posterior Vitreous Normal Posterior vitreous detachment   Disc Normal Normal   C/D Ratio 0.45 0.45   Macula Intermediate age related macular degeneration Intermediate age related macular degeneration   Vessels Normal Normal   Periphery Normal Normal            IMAGING AND PROCEDURES  Imaging and Procedures for 08/24/20  OCT, Retina - OU - Both Eyes       Right Eye Quality was good. Scan locations included subfoveal. Central Foveal Thickness: 426. Findings include abnormal foveal contour.   Left Eye Quality was good. Scan locations included subfoveal. Central Foveal Thickness: 403. Progression has no prior data. Findings include abnormal foveal contour.    Notes Large subfoveal drusenoid deposit in the macular region with a small perifoveal operculum in the vitreous but no definablePVD.  Does not appear to have vitreomacular traction  OS with subfoveal drusenoid deposit also large but also with a collection of serous subretinal fluid suggestive of wet AMD.  Small preverbal operculum present but no clear definable PVD by OCT as seen on clinical exam     Color Fundus Photography Optos - OU - Both Eyes       Right Eye Progression has no prior data. Disc findings include normal observations. Macula : drusen. Vessels : normal observations. Periphery : normal observations.   Left Eye Progression has no prior data. Disc findings include normal observations. Macula : drusen. Vessels : normal observations. Periphery : normal observations.   Notes Adult vitelliform macular dystrophy type large drusenoid subfoveal deposit in the pigment epithelial detachment type arrangement     Intravitreal Injection, Pharmacologic Agent - OS - Left Eye       Time Out 08/24/2020. 4:21 PM. Confirmed correct patient, procedure, site, and patient consented.   Anesthesia Topical anesthesia was used. Anesthetic medications included Akten 3.5%.   Procedure Preparation included Tobramycin 0.3%, 10% betadine to eyelids, 5% betadine to ocular surface. A 30 gauge needle was used.   Injection: 2.5 mg bevacizumab 2.5 MG/0.1ML   Route: Intravitreal, Site: Left Eye   NDC: [phone], Lot: [id number]   Post-op Post injection exam found visual acuity of at least counting fingers. The patient tolerated the procedure well. There were no complications. The patient received written and verbal post procedure care education. Post injection medications were not given.              ASSESSMENT/PLAN:  Exudative age-related macular degeneration of left eye with active choroidal neovascularization (HCC) The nature of age realated macular degeneration (ARMD)is explained  as follows: The dry form refers to the progressive loss of normal blood supply to the central vision as a result of a combination of factors which include aging blood supply (arteriosclerosis, hardening of the arteries), genetics, smoking habits, and history of hypertension. Currently, no eye medications or vitamins slow this type of aging effect upon vision, however cessation of smoking  and controlling hypertension help slow the disorder. The following analogy helps explain this: I describe the dry form of ARMD like a house of the same age as your eyes, which shows typical wear and tear of age upon the house structure and appearance. Like the aging house which can fall down structurally, the dry form of ARMD can deteriorate the structure of the macula (center) of the retina, most often gradually and affect central vision tasks such as reading and driving. The wet form of ARMD refers to the development of abnormally growing blood vessels usually near or under the central vision, with potential risk of permanent visual changes or vision losses. This complication of the Dry form of ARMD may be moderately reduced by use of AREDS 2 formula multivitamins daily. I describe the Wet form of ARMD as like the development of a fire in an aging house (DRY ARMD). It may develop suddenly, progress rapidly and be destructive based on where it starts and how big the fire is when found. In the eye, the house fire analogy refers to the abnormal blood vessels growing destructively near the central vison in the retina, or film of the eye. Halting the growth of blood vessels with laser (hot or cold) or injectable medications is best way "to put the fire out". Many patients will experience a stabilization or even improvement in vision with a treatment course, while others may still face a loss of vision. The use of injectable medications has revolutionized therapy and is currently the only proven therapy to provide the chance of stable  or improved acuity from new and recent destructive wet ARMD.  The nature of wet macular degeneration was discussed with the patient.  Forms of therapy reviewed include the use of Anti-VEGF medications injected painlessly into the eye, as well as other possible treatment modalities, including thermal laser therapy. Fellow eye involvement and risks were discussed with the patient. Upon the finding of wet age related macular degeneration, treatment will be offered. The treatment regimen is on a treat as needed basis with the intent to treat if necessary and extend interval of exams when possible. On average 1 out of 6 patients do not need lifetime therapy. However, the risk of recurrent disease is high for a lifetime.  Initially monthly, then periodic, examinations and evaluations will determine whether the next treatment is required on the day of the examination.  Obstructive sleep apnea on CPAP On CPAP nightly with excellent success.  Patient reports 50-60 episodes hourly per nightly down to 5 episodes at most per hour now on therapy.  I have asked the patient to follow-up with Dr. Elenore Rota to determine if there is any way to further enhance and maximize efficiency and efficacy of CPAP to the point of 0 episodes hourly, as any nightly hypoxic stress from sleep apnea can make the macular degenerative condition worse  Adult onset vitelliform macular dystrophy OU, with large subfoveal drusenoid pigment epithelial deposits.  No obvious sign of vitreomacular adhesion contributing to this anatomic feature.  Moreover I did ask promptly if the patient might have sleep apnea which he applied in the affirmative saying he had been diagnosed some 7 years previous with very severe sleep apnea which she has been highly compliant yet still has 5 episodes per hour of apneic per his machine readout     ICD-10-CM   1. Exudative age-related macular degeneration of left eye with active choroidal neovascularization (HCC)   H35.3221 OCT, Retina - OU - Both Eyes  Color Fundus Photography Optos - OU - Both Eyes    Intravitreal Injection, Pharmacologic Agent - OS - Left Eye    bevacizumab (AVASTIN) SOSY 2.5 mg    2. Intermediate stage nonexudative age-related macular degeneration of both eyes  H35.3132 OCT, Retina - OU - Both Eyes    Color Fundus Photography Optos - OU - Both Eyes    3. Adult onset vitelliform macular dystrophy  H35.54     4. Obstructive sleep apnea on CPAP  G47.33    Z99.89       1.  OS we will commence with therapy today for wet AMD with serous subretinal fluid adjacent to the large drusenoid deposit in the foveal location.  2.  Follow-up examination in 5 to 6 weeks next possible injection likely injection intravitreal Avastin OS next  3.  I requested patient to seek some form of evaluation or consultation with Carlena Sax to see if it is possible to accomplish a closer to 0 events per hour of apnea during his sleeping time  Ophthalmic Meds Ordered this visit:  Meds ordered this encounter  Medications   bevacizumab (AVASTIN) SOSY 2.5 mg       Return in about 5 weeks (around 09/28/2020) for dilate, OS, AVASTIN OCT.  There are no Patient Instructions on file for this visit.   Explained the diagnoses, plan, and follow up with the patient and they expressed understanding.  Patient expressed understanding of the importance of proper follow up care.   Clent Demark Timmie Dugue M.D. Diseases & Surgery of the Retina and Vitreous [facility] 08/24/20     Abbreviations: M myopia (nearsighted); A astigmatism; H hyperopia (farsighted); P presbyopia; Mrx spectacle prescription;  CTL contact lenses; OD right eye; OS left eye; OU both eyes  XT exotropia; ET esotropia; PEK punctate epithelial keratitis; PEE punctate epithelial erosions; DES dry eye syndrome; MGD meibomian gland dysfunction; ATs artificial tears; PFAT's preservative free artificial tears; [place] nuclear sclerotic cataract;  PSC posterior subcapsular cataract; ERM epi-retinal membrane; PVD posterior vitreous detachment; RD retinal detachment; DM diabetes mellitus; DR diabetic retinopathy; NPDR non-proliferative diabetic retinopathy; PDR proliferative diabetic retinopathy; CSME clinically significant macular edema; DME diabetic macular edema; dbh dot blot hemorrhages; CWS cotton wool spot; POAG primary open angle glaucoma; C/D cup-to-disc ratio; HVF humphrey visual field; GVF goldmann visual field; OCT optical coherence tomography; IOP intraocular pressure; BRVO Branch retinal vein occlusion; CRVO central retinal vein occlusion; CRAO central retinal artery occlusion; BRAO branch retinal artery occlusion; RT retinal tear; SB scleral buckle; PPV pars plana vitrectomy; VH Vitreous hemorrhage; PRP panretinal laser photocoagulation; IVK intravitreal kenalog; VMT vitreomacular traction; MH Macular hole;  NVD neovascularization of the disc; NVE neovascularization elsewhere; AREDS age related eye disease study; ARMD age related macular degeneration; POAG primary open angle glaucoma; EBMD epithelial/anterior basement membrane dystrophy; ACIOL anterior chamber intraocular lens; IOL intraocular lens; PCIOL posterior chamber intraocular lens; Phaco/IOL phacoemulsification with intraocular lens placement; [place] photorefractive keratectomy; LASIK laser assisted in situ keratomileusis; HTN hypertension; DM diabetes mellitus; COPD chronic obstructive pulmonary disease

## 2020-08-24 NOTE — Assessment & Plan Note (Signed)
The nature of age realated macular degeneration (ARMD)is explained as follows: The dry form refers to the progressive loss of normal blood supply to the central vision as a result of a combination of factors which include aging blood supply (arteriosclerosis, hardening of the arteries), genetics, smoking habits, and history of hypertension. Currently, no eye medications or vitamins slow this type of aging effect upon vision, however cessation of smoking and controlling hypertension help slow the disorder. The following analogy helps explain this: I describe the dry form of ARMD like a house of the same age as your eyes, which shows typical wear and tear of age upon the house structure and appearance. Like the aging house which can fall down structurally, the dry form of ARMD can deteriorate the structure of the macula (center) of the retina, most often gradually and affect central vision tasks such as reading and driving. The wet form of ARMD refers to the development of abnormally growing blood vessels usually near or under the central vision, with potential risk of permanent visual changes or vision losses. This complication of the Dry form of ARMD may be moderately reduced by use of AREDS 2 formula multivitamins daily. I describe the Wet form of ARMD as like the development of a fire in an aging house (DRY ARMD). It may develop suddenly, progress rapidly and be destructive based on where it starts and how big the fire is when found. In the eye, the house fire analogy refers to the abnormal blood vessels growing destructively near the central vison in the retina, or film of the eye. Halting the growth of blood vessels with laser (hot or cold) or injectable medications is best way "to put the fire out". Many patients will experience a stabilization or even improvement in vision with a treatment course, while others may still face a loss of vision. The use of injectable medications has revolutionized therapy and is  currently the only proven therapy to provide the chance of stable or improved acuity from new and recent destructive wet ARMD.  The nature of wet macular degeneration was discussed with the patient.  Forms of therapy reviewed include the use of Anti-VEGF medications injected painlessly into the eye, as well as other possible treatment modalities, including thermal laser therapy. Fellow eye involvement and risks were discussed with the patient. Upon the finding of wet age related macular degeneration, treatment will be offered. The treatment regimen is on a treat as needed basis with the intent to treat if necessary and extend interval of exams when possible. On average 1 out of 6 patients do not need lifetime therapy. However, the risk of recurrent disease is high for a lifetime.  Initially monthly, then periodic, examinations and evaluations will determine whether the next treatment is required on the day of the examination.

## 2020-08-24 NOTE — Assessment & Plan Note (Signed)
On CPAP nightly with excellent success.  Patient reports 50-60 episodes hourly per nightly down to 5 episodes at most per hour now on therapy.  I have asked the patient to follow-up with Dr. Elenore Rota to determine if there is any way to further enhance and maximize efficiency and efficacy of CPAP to the point of 0 episodes hourly, as any nightly hypoxic stress from sleep apnea can make the macular degenerative condition worse

## 2020-08-24 NOTE — Assessment & Plan Note (Signed)
OU, with large subfoveal drusenoid pigment epithelial deposits.  No obvious sign of vitreomacular adhesion contributing to this anatomic feature.  Moreover I did ask promptly if the patient might have sleep apnea which he applied in the affirmative saying he had been diagnosed some 7 years previous with very severe sleep apnea which she has been highly compliant yet still has 5 episodes per hour of apneic per his machine readout

## 2020-09-30 ENCOUNTER — Ambulatory Visit (INDEPENDENT_AMBULATORY_CARE_PROVIDER_SITE_OTHER): Payer: Medicare HMO | Admitting: Ophthalmology

## 2020-09-30 ENCOUNTER — Other Ambulatory Visit: Payer: Self-pay

## 2020-09-30 ENCOUNTER — Encounter (INDEPENDENT_AMBULATORY_CARE_PROVIDER_SITE_OTHER): Payer: Self-pay | Admitting: Ophthalmology

## 2020-09-30 DIAGNOSIS — Z9989 Dependence on other enabling machines and devices: Secondary | ICD-10-CM | POA: Diagnosis not present

## 2020-09-30 DIAGNOSIS — H353221 Exudative age-related macular degeneration, left eye, with active choroidal neovascularization: Secondary | ICD-10-CM

## 2020-09-30 DIAGNOSIS — H353132 Nonexudative age-related macular degeneration, bilateral, intermediate dry stage: Secondary | ICD-10-CM

## 2020-09-30 DIAGNOSIS — G4733 Obstructive sleep apnea (adult) (pediatric): Secondary | ICD-10-CM

## 2020-09-30 NOTE — Assessment & Plan Note (Signed)
Less subretinal fluid adjacent to pseudo vitelliform type lesion in subfoveal location OS with slight change if not improvement in acuity OS.  Today at 5 weeks postinjection Avastin.  We will repeat injection today and follow-up again in 5 weeks dilate OU and monitor OD closely

## 2020-09-30 NOTE — Assessment & Plan Note (Signed)
Compliant on CPAP at this time

## 2020-09-30 NOTE — Progress Notes (Signed)
09/30/2020     CHIEF COMPLAINT Patient presents for  Chief Complaint  Patient presents with   Retina Follow Up      HISTORY OF PRESENT ILLNESS: Joseph Moses is a 75 y.o. male who presents to the clinic today for:   HPI     Retina Follow Up   Patient presents with  Wet AMD.  In left eye.  This started 5 weeks ago.  Severity is mild.  Duration of 5 weeks.        Comments   5 week f/u OS with OCT, possible Avastin OS  Pt denies any vision changes in the left eye since previous visit. Pt c/o seeing several new floaters in the left eye following the last injection, have improved and lessened in number. Pt denies any flashes of light, dark curtain/veil. Pt denies any pain in or around the eye.       Last edited by [name], COA on 09/30/2020 10:12 AM.      Referring physician: Hulan Fess, MD [place],  [place] [address]  HISTORICAL INFORMATION:   Selected notes from the MEDICAL RECORD NUMBER       CURRENT MEDICATIONS: No current outpatient medications on file. (Ophthalmic Drugs)   No current facility-administered medications for this visit. (Ophthalmic Drugs)   Current Outpatient Medications (Other)  Medication Sig   aspirin EC 81 MG tablet Take 81 mg by mouth daily.   No current facility-administered medications for this visit. (Other)      REVIEW OF SYSTEMS:    ALLERGIES No Known Allergies  PAST MEDICAL HISTORY History reviewed. No pertinent past medical history. Past Surgical History:  Procedure Laterality Date   CERVICAL DISCECTOMY  2001 &2005   NASAL SEPTUM SURGERY  1968    FAMILY HISTORY History reviewed. No pertinent family history.  SOCIAL HISTORY Social History   Tobacco Use   Smoking status: Never   Smokeless tobacco: Never  Vaping Use   Vaping Use: Never used  Substance Use Topics   Alcohol use: Yes    Alcohol/week: 1.0 standard drink    Types: 1 Cans of beer per week   Drug use: No          OPHTHALMIC EXAM:  Base Eye Exam     Visual Acuity (ETDRS)       Right Left   Dist cc 20/30 -2 20/70 +1   Dist ph cc  20/50 +2    Correction: Glasses         Tonometry (Tonopen, 10:20 AM)       Right Left   Pressure 6 8         Pupils       Pupils Dark Light Shape React APD   Right PERRL 5 4 Round Brisk None   Left PERRL 5 4 Round Brisk None         Visual Fields (Counting fingers)       Left Right    Full Full         Extraocular Movement       Right Left    Full, Ortho Full, Ortho         Neuro/Psych     Oriented x3: Yes   Mood/Affect: Normal         Dilation     Left eye: 1.0% Mydriacyl, 2.5% Phenylephrine @ 10:20 AM           Slit Lamp and Fundus Exam  External Exam       Right Left   External Normal Normal         Slit Lamp Exam       Right Left   Lids/Lashes Normal Normal   Conjunctiva/Sclera White and quiet White and quiet   Cornea Clear Clear   Anterior Chamber Deep and quiet Deep and quiet   Iris Round and reactive Round and reactive   Lens Posterior chamber intraocular lens, trace PCO Posterior chamber intraocular lens, 2+ Posterior capsular opacification   Anterior Vitreous Normal Normal         Fundus Exam       Right Left   Posterior Vitreous  Posterior vitreous detachment   Disc  Normal   C/D Ratio  0.45   Macula  Intermediate age related macular degeneration, large subfoveal drusenoid deposit, vitelliform like   Vessels  Normal   Periphery  Normal            IMAGING AND PROCEDURES  Imaging and Procedures for 09/30/20  OCT, Retina - OU - Both Eyes       Right Eye Quality was good. Scan locations included subfoveal. Central Foveal Thickness: 426. Progression has been stable. Findings include abnormal foveal contour, subretinal hyper-reflective material.   Left Eye Quality was good. Scan locations included subfoveal. Central Foveal Thickness: 403. Progression has improved. Findings  include abnormal foveal contour, subretinal hyper-reflective material.   Notes Large subfoveal drusenoid deposit in the macular region with a small perifoveal operculum in the vitreous but no definablePVD.  Does not appear to have vitreomacular traction  OS with subfoveal drusenoid deposit also large but also with a collection of serous subretinal fluid suggestive of wet AMD.  Small preverbal operculum present but no clear definable PVD by OCT as seen on clinical exam             ASSESSMENT/PLAN:  Exudative age-related macular degeneration of left eye with active choroidal neovascularization (HCC) Less subretinal fluid adjacent to pseudo vitelliform type lesion in subfoveal location OS with slight change if not improvement in acuity OS.  Today at 5 weeks postinjection Avastin.  We will repeat injection today and follow-up again in 5 weeks dilate OU and monitor OD closely  Intermediate stage nonexudative age-related macular degeneration of both eyes Large vitelliform subfoveal drusenoid lesion present OU, monitor OD carefully  Obstructive sleep apnea on CPAP Compliant on CPAP at this time     ICD-10-CM   1. Intermediate stage nonexudative age-related macular degeneration of both eyes  H35.3132 OCT, Retina - OU - Both Eyes    2. Exudative age-related macular degeneration of left eye with active choroidal neovascularization ([place])  H35.3221     3. Obstructive sleep apnea on CPAP  G47.33    Z99.89       1.  Improved acuity OS and slightly less subretinal fluid adjacent to subfoveal drusenoid vitelliform eye deposit OS.  Post Avastin at 5 weeks.  Repeat injection today and examination next again in 5 weeks  2.  I will recommend dilate OU next in preparation for possible injection left eye but monitor the condition of the right eye with a similar lesion yet no impact on acuity  3.  Ophthalmic Meds Ordered this visit:  No orders of the defined types were placed in this  encounter.      Return in about 5 weeks (around 11/04/2020) for DILATE OU, AVASTIN OCT, OS.  There are no Patient Instructions on file for this visit.  Explained the diagnoses, plan, and follow up with the patient and they expressed understanding.  Patient expressed understanding of the importance of proper follow up care.   Clent Demark Michol Emory M.D. Diseases & Surgery of the Retina and Vitreous Retina & Diabetic [place] 09/30/20     Abbreviations: M myopia (nearsighted); A astigmatism; H hyperopia (farsighted); P presbyopia; Mrx spectacle prescription;  CTL contact lenses; OD right eye; OS left eye; OU both eyes  XT exotropia; ET esotropia; PEK punctate epithelial keratitis; PEE punctate epithelial erosions; DES dry eye syndrome; MGD meibomian gland dysfunction; ATs artificial tears; PFAT's preservative free artificial tears; [place] nuclear sclerotic cataract; PSC posterior subcapsular cataract; ERM epi-retinal membrane; PVD posterior vitreous detachment; RD retinal detachment; DM diabetes mellitus; DR diabetic retinopathy; NPDR non-proliferative diabetic retinopathy; PDR proliferative diabetic retinopathy; CSME clinically significant macular edema; DME diabetic macular edema; dbh dot blot hemorrhages; CWS cotton wool spot; POAG primary open angle glaucoma; C/D cup-to-disc ratio; HVF humphrey visual field; GVF goldmann visual field; OCT optical coherence tomography; IOP intraocular pressure; BRVO Branch retinal vein occlusion; CRVO central retinal vein occlusion; CRAO central retinal artery occlusion; BRAO branch retinal artery occlusion; RT retinal tear; SB scleral buckle; PPV pars plana vitrectomy; VH Vitreous hemorrhage; PRP panretinal laser photocoagulation; IVK intravitreal kenalog; VMT vitreomacular traction; MH Macular hole;  NVD neovascularization of the disc; NVE neovascularization elsewhere; AREDS age related eye disease study; ARMD age related macular degeneration; POAG primary open angle  glaucoma; EBMD epithelial/anterior basement membrane dystrophy; ACIOL anterior chamber intraocular lens; IOL intraocular lens; PCIOL posterior chamber intraocular lens; Phaco/IOL phacoemulsification with intraocular lens placement; [place] photorefractive keratectomy; LASIK laser assisted in situ keratomileusis; HTN hypertension; DM diabetes mellitus; COPD chronic obstructive pulmonary disease

## 2020-09-30 NOTE — Assessment & Plan Note (Signed)
Large vitelliform subfoveal drusenoid lesion present OU, monitor OD carefully

## 2020-10-01 DIAGNOSIS — G4733 Obstructive sleep apnea (adult) (pediatric): Secondary | ICD-10-CM | POA: Diagnosis not present

## 2020-10-12 DIAGNOSIS — H26492 Other secondary cataract, left eye: Secondary | ICD-10-CM | POA: Diagnosis not present

## 2020-10-14 DIAGNOSIS — G4733 Obstructive sleep apnea (adult) (pediatric): Secondary | ICD-10-CM | POA: Diagnosis not present

## 2020-11-02 DIAGNOSIS — Z0100 Encounter for examination of eyes and vision without abnormal findings: Secondary | ICD-10-CM | POA: Diagnosis not present

## 2020-11-04 ENCOUNTER — Other Ambulatory Visit: Payer: Self-pay

## 2020-11-04 ENCOUNTER — Encounter (INDEPENDENT_AMBULATORY_CARE_PROVIDER_SITE_OTHER): Payer: Self-pay | Admitting: Ophthalmology

## 2020-11-04 ENCOUNTER — Ambulatory Visit (INDEPENDENT_AMBULATORY_CARE_PROVIDER_SITE_OTHER): Payer: Medicare HMO | Admitting: Ophthalmology

## 2020-11-04 DIAGNOSIS — H3554 Dystrophies primarily involving the retinal pigment epithelium: Secondary | ICD-10-CM

## 2020-11-04 DIAGNOSIS — H353221 Exudative age-related macular degeneration, left eye, with active choroidal neovascularization: Secondary | ICD-10-CM | POA: Diagnosis not present

## 2020-11-04 DIAGNOSIS — G4733 Obstructive sleep apnea (adult) (pediatric): Secondary | ICD-10-CM | POA: Diagnosis not present

## 2020-11-04 DIAGNOSIS — Z9989 Dependence on other enabling machines and devices: Secondary | ICD-10-CM

## 2020-11-04 DIAGNOSIS — H353132 Nonexudative age-related macular degeneration, bilateral, intermediate dry stage: Secondary | ICD-10-CM

## 2020-11-04 MED ORDER — BEVACIZUMAB 2.5 MG/0.1ML IZ SOSY
2.5000 mg | PREFILLED_SYRINGE | INTRAVITREAL | Status: AC | PRN
  Administered 2020-11-04: 2.5 mg via INTRAVITREAL

## 2020-11-04 NOTE — Assessment & Plan Note (Signed)
Continues on CPAP use effectively

## 2020-11-04 NOTE — Assessment & Plan Note (Signed)
OD will continue to monitor to look for signs and complications

## 2020-11-04 NOTE — Progress Notes (Signed)
11/04/2020     CHIEF COMPLAINT Patient presents for  Chief Complaint  Patient presents with   Retina Follow Up      HISTORY OF PRESENT ILLNESS: Joseph Moses is a 75 y.o. male who presents to the clinic today for:   HPI     Retina Follow Up   Patient presents with  Wet AMD.  In left eye.  This started 5 weeks ago.  Severity is mild.  Duration of 5 weeks.        Comments   5 week fu OU oct avastin OS. Patient states vision is stable and unchanged since last visit. Denies any new floaters or FOL.       Last edited by Laurin Coder on 11/04/2020  8:31 AM.      Referring physician: [name], MD [place],  [place] [address]  HISTORICAL INFORMATION:   Selected notes from the MEDICAL RECORD NUMBER       CURRENT MEDICATIONS: No current outpatient medications on file. (Ophthalmic Drugs)   No current facility-administered medications for this visit. (Ophthalmic Drugs)   Current Outpatient Medications (Other)  Medication Sig   aspirin EC 81 MG tablet Take 81 mg by mouth daily.   No current facility-administered medications for this visit. (Other)      REVIEW OF SYSTEMS:    ALLERGIES No Known Allergies  PAST MEDICAL HISTORY History reviewed. No pertinent past medical history. Past Surgical History:  Procedure Laterality Date   CERVICAL DISCECTOMY  2001 &2005   NASAL SEPTUM SURGERY  1968    FAMILY HISTORY History reviewed. No pertinent family history.  SOCIAL HISTORY Social History   Tobacco Use   Smoking status: Never   Smokeless tobacco: Never  Vaping Use   Vaping Use: Never used  Substance Use Topics   Alcohol use: Yes    Alcohol/week: 1.0 standard drink    Types: 1 Cans of beer per week   Drug use: No         OPHTHALMIC EXAM:  Base Eye Exam     Visual Acuity (Snellen - Linear)       Right Left   Dist cc 20/30 -2 20/70 -1   Dist ph cc  20/30         Tonometry (Tonopen, 8:35 AM)       Right Left    Pressure 14 17         Pupils       Pupils Dark Light Shape React APD   Right PERRL 4 3 Round Brisk None   Left PERRL 4 3 Round Brisk None         Extraocular Movement       Right Left    Full Full         Neuro/Psych     Oriented x3: Yes   Mood/Affect: Normal         Dilation     Both eyes: 1.0% Mydriacyl, 2.5% Phenylephrine @ 8:35 AM           Slit Lamp and Fundus Exam     External Exam       Right Left   External Normal Normal         Slit Lamp Exam       Right Left   Lids/Lashes Normal Normal   Conjunctiva/Sclera White and quiet White and quiet   Cornea Clear Clear   Anterior Chamber Deep and quiet Deep and quiet  Iris Round and reactive Round and reactive   Lens Posterior chamber intraocular lens, trace PCO Posterior chamber intraocular lens, 2+ Posterior capsular opacification   Anterior Vitreous Normal Normal         Fundus Exam       Right Left   Posterior Vitreous  Posterior vitreous detachment   Disc  Normal   C/D Ratio  0.45   Macula  Intermediate age related macular degeneration, large subfoveal drusenoid deposit, vitelliform like   Vessels  Normal   Periphery  Normal            IMAGING AND PROCEDURES  Imaging and Procedures for 11/04/20  OCT, Retina - OU - Both Eyes       Right Eye Quality was good. Scan locations included subfoveal. Central Foveal Thickness: 429. Progression has been stable. Findings include abnormal foveal contour, subretinal hyper-reflective material.   Left Eye Quality was good. Scan locations included subfoveal. Central Foveal Thickness: 366. Progression has improved. Findings include abnormal foveal contour, subretinal hyper-reflective material.   Notes Large subfoveal drusenoid deposit in the macular region with a small perifoveal operculum in the vitreous but no definable PVD.  Does not appear to have vitreomacular traction  OS with subfoveal drusenoid deposit also large but also with  a collection of serous subretinal fluid suggestive of wet AMD.  OS with center involved less thickening with a 23 m diminishment in elevation and focal intraretinal elevation seems to have diminished a little bit.  May not have significant vitreal macular traction we will continue to monitor and follow  Small perifoveal operculum present but no clear definable PVD by OCT as seen on clinical exam     Intravitreal Injection, Pharmacologic Agent - OS - Left Eye       Time Out 11/04/2020. 9:09 AM. Confirmed correct patient, procedure, site, and patient consented.   Anesthesia Topical anesthesia was used. Anesthetic medications included [place] 3.5%.   Procedure Preparation included Tobramycin 0.3%, 10% betadine to eyelids, 5% betadine to ocular surface. A 30 gauge needle was used.   Injection: 2.5 mg bevacizumab 2.5 MG/0.1ML   Route: Intravitreal, Site: Left Eye   NDC: [phone], Lot: [id number]   Post-op Post injection exam found visual acuity of at least counting fingers. The patient tolerated the procedure well. There were no complications. The patient received written and verbal post procedure care education. Post injection medications included ocuflox.              ASSESSMENT/PLAN:  Obstructive sleep apnea on CPAP Continues on CPAP use effectively  Adult onset vitelliform macular dystrophy OD will continue to monitor to look for signs and complications      [id number]   1. Intermediate stage nonexudative age-related macular degeneration of both eyes  H35.3132 OCT, Retina - OU - Both Eyes    Intravitreal Injection, Pharmacologic Agent - OS - Left Eye    bevacizumab (AVASTIN) SOSY 2.5 mg    2. Obstructive sleep apnea on CPAP  G47.33    Z99.89     3. Adult onset vitelliform macular dystrophy  H35.54       1.  OU with adult pseudo vitelliform macular dystrophy with OS with subretinal fluid onset of Therapy began 08-24-2020.  Overall OS with less retinal thickening,  less foveal elevation and slightly less subfoveal fluid.  We will continue to treat with Avastin, repeat injection today at 5-week interval  2.  Patient understands with diagnosis of systemic OSA, on CPAP is critical to prevent  nightly hypoxic and hypertensive stress to the macular region and the remainder of the CNS  3.  Ophthalmic Meds Ordered this visit:  Meds ordered this encounter  Medications   bevacizumab (AVASTIN) SOSY 2.5 mg       Return in about 5 weeks (around 12/09/2020) for dilate, OS, AVASTIN OCT.  There are no Patient Instructions on file for this visit.   Explained the diagnoses, plan, and follow up with the patient and they expressed understanding.  Patient expressed understanding of the importance of proper follow up care.   Clent Demark Kiosha Buchan M.D. Diseases & Surgery of the Retina and Vitreous Retina & Diabetic [place] 11/04/20     Abbreviations: M myopia (nearsighted); A astigmatism; H hyperopia (farsighted); P presbyopia; Mrx spectacle prescription;  CTL contact lenses; OD right eye; OS left eye; OU both eyes  XT exotropia; ET esotropia; PEK punctate epithelial keratitis; PEE punctate epithelial erosions; DES dry eye syndrome; MGD meibomian gland dysfunction; ATs artificial tears; PFAT's preservative free artificial tears; [place] nuclear sclerotic cataract; PSC posterior subcapsular cataract; ERM epi-retinal membrane; PVD posterior vitreous detachment; RD retinal detachment; DM diabetes mellitus; DR diabetic retinopathy; NPDR non-proliferative diabetic retinopathy; PDR proliferative diabetic retinopathy; CSME clinically significant macular edema; DME diabetic macular edema; dbh dot blot hemorrhages; CWS cotton wool spot; POAG primary open angle glaucoma; C/D cup-to-disc ratio; HVF humphrey visual field; GVF goldmann visual field; OCT optical coherence tomography; IOP intraocular pressure; BRVO Branch retinal vein occlusion; CRVO central retinal vein occlusion; CRAO  central retinal artery occlusion; BRAO branch retinal artery occlusion; RT retinal tear; SB scleral buckle; PPV pars plana vitrectomy; VH Vitreous hemorrhage; PRP panretinal laser photocoagulation; IVK intravitreal kenalog; VMT vitreomacular traction; MH Macular hole;  NVD neovascularization of the disc; NVE neovascularization elsewhere; AREDS age related eye disease study; ARMD age related macular degeneration; POAG primary open angle glaucoma; EBMD epithelial/anterior basement membrane dystrophy; ACIOL anterior chamber intraocular lens; IOL intraocular lens; PCIOL posterior chamber intraocular lens; Phaco/IOL phacoemulsification with intraocular lens placement; [place] photorefractive keratectomy; LASIK laser assisted in situ keratomileusis; HTN hypertension; DM diabetes mellitus; COPD chronic obstructive pulmonary disease

## 2020-11-05 ENCOUNTER — Encounter (INDEPENDENT_AMBULATORY_CARE_PROVIDER_SITE_OTHER): Payer: Medicare HMO | Admitting: Ophthalmology

## 2020-11-16 DIAGNOSIS — H6992 Unspecified Eustachian tube disorder, left ear: Secondary | ICD-10-CM | POA: Diagnosis not present

## 2020-11-16 DIAGNOSIS — Z6824 Body mass index (BMI) 24.0-24.9, adult: Secondary | ICD-10-CM | POA: Diagnosis not present

## 2020-12-09 ENCOUNTER — Encounter (INDEPENDENT_AMBULATORY_CARE_PROVIDER_SITE_OTHER): Payer: Self-pay | Admitting: Ophthalmology

## 2020-12-09 ENCOUNTER — Ambulatory Visit (INDEPENDENT_AMBULATORY_CARE_PROVIDER_SITE_OTHER): Payer: Medicare HMO | Admitting: Ophthalmology

## 2020-12-09 ENCOUNTER — Other Ambulatory Visit: Payer: Self-pay

## 2020-12-09 DIAGNOSIS — H353132 Nonexudative age-related macular degeneration, bilateral, intermediate dry stage: Secondary | ICD-10-CM | POA: Diagnosis not present

## 2020-12-09 DIAGNOSIS — G4733 Obstructive sleep apnea (adult) (pediatric): Secondary | ICD-10-CM | POA: Diagnosis not present

## 2020-12-09 DIAGNOSIS — Z9989 Dependence on other enabling machines and devices: Secondary | ICD-10-CM

## 2020-12-09 DIAGNOSIS — H353221 Exudative age-related macular degeneration, left eye, with active choroidal neovascularization: Secondary | ICD-10-CM

## 2020-12-09 DIAGNOSIS — H3554 Dystrophies primarily involving the retinal pigment epithelium: Secondary | ICD-10-CM

## 2020-12-09 MED ORDER — BEVACIZUMAB 2.5 MG/0.1ML IZ SOSY
2.5000 mg | PREFILLED_SYRINGE | INTRAVITREAL | Status: AC | PRN
  Administered 2020-12-09: 2.5 mg via INTRAVITREAL

## 2020-12-09 NOTE — Assessment & Plan Note (Signed)
Bilateral pseudo vitelliform macular dystrophy OU.

## 2020-12-09 NOTE — Progress Notes (Signed)
12/09/2020     CHIEF COMPLAINT Patient presents for  Chief Complaint  Patient presents with   Retina Follow Up      HISTORY OF PRESENT ILLNESS: Joseph Moses is a 75 y.o. male who presents to the clinic today for:   HPI     Retina Follow Up   Patient presents with  Wet AMD.  In left eye.  This started 5 weeks ago.  Duration of 5 weeks.  Since onset it is stable.        Comments   5 week f/u OS with OCT and possible Avastin injection      Last edited by [name], COA on 12/09/2020 11:00 AM.      Referring physician: [name], MD [place],  [place] [id number]  HISTORICAL INFORMATION:   Selected notes from the [place]: No current outpatient medications on file. (Ophthalmic Drugs)   No current facility-administered medications for this visit. (Ophthalmic Drugs)   Current Outpatient Medications (Other)  Medication Sig   aspirin EC 81 MG tablet Take 81 mg by mouth daily.   No current facility-administered medications for this visit. (Other)      REVIEW OF SYSTEMS:    ALLERGIES No Known Allergies  PAST MEDICAL HISTORY History reviewed. No pertinent past medical history. Past Surgical History:  Procedure Laterality Date   CERVICAL DISCECTOMY  2001 &2005   NASAL SEPTUM SURGERY  1968    FAMILY HISTORY History reviewed. No pertinent family history.  SOCIAL HISTORY Social History   Tobacco Use   Smoking status: Never   Smokeless tobacco: Never  Vaping Use   Vaping Use: Never used  Substance Use Topics   Alcohol use: Yes    Alcohol/week: 1.0 standard drink    Types: 1 Cans of beer per week   Drug use: No         OPHTHALMIC EXAM:  Base Eye Exam     Visual Acuity (ETDRS)       Right Left   Dist cc 20/20 -1 20/50 -1   Dist ph cc  20/20 -1    Correction: Glasses         Tonometry (Tonopen, 11:09 AM)       Right Left   Pressure 13 12         Pupils        Pupils Dark Light Shape React APD   Right PERRL 4 3 Round Brisk None   Left PERRL 4 3 Round Brisk None         Visual Fields (Counting fingers)       Left Right    Full Full         Extraocular Movement       Right Left    Full, Ortho Full, Ortho         Neuro/Psych     Oriented x3: Yes   Mood/Affect: Normal         Dilation     Left eye: 1.0% Mydriacyl, 2.5% Phenylephrine @ 11:09 AM           Slit Lamp and Fundus Exam     External Exam       Right Left   External Normal Normal         Slit Lamp Exam       Right Left   Lids/Lashes Normal Normal   Conjunctiva/Sclera White and quiet  White and quiet   Cornea Clear Clear   Anterior Chamber Deep and quiet Deep and quiet   Iris Round and reactive Round and reactive   Lens Posterior chamber intraocular lens, trace PCO Posterior chamber intraocular lens, 2+ Posterior capsular opacification   Anterior Vitreous Normal Normal         Fundus Exam       Right Left   Posterior Vitreous  Posterior vitreous detachment   Disc  Normal   C/D Ratio  0.45   Macula  Intermediate age related macular degeneration, large subfoveal drusenoid deposit, vitelliform like   Vessels  Normal   Periphery  Normal            IMAGING AND PROCEDURES  Imaging and Procedures for 12/09/20  OCT, Retina - OU - Both Eyes       Right Eye Quality was good. Scan locations included subfoveal. Central Foveal Thickness: 433. Progression has been stable. Findings include abnormal foveal contour, subretinal hyper-reflective material.   Left Eye Quality was good. Scan locations included subfoveal. Central Foveal Thickness: 366. Progression has improved. Findings include abnormal foveal contour, subretinal hyper-reflective material.   Notes Large subfoveal drusenoid deposit in the macular region with a small perifoveal operculum in the vitreous but no definable PVD.  Does not appear to have vitreomacular traction  OS with  subfoveal drusenoid deposit also large but also with a collection of serous subretinal fluid suggestive of wet AMD.  OS with center involved less thickening with a 23 m diminishment in elevation and focal intraretinal elevation seems to have diminished a little bit.  May not have significant vitreal macular traction we will continue to monitor and follow  Small perifoveal operculum present but no clear definable PVD by OCT as seen on clinical exam     Intravitreal Injection, Pharmacologic Agent - OS - Left Eye       Time Out 12/09/2020. 12:06 PM. Confirmed correct patient, procedure, site, and patient consented.   Anesthesia Topical anesthesia was used. Anesthetic medications included [place] 3.5%.   Procedure Preparation included Tobramycin 0.3%, 10% betadine to eyelids, 5% betadine to ocular surface. A 30 gauge needle was used.   Injection: 2.5 mg bevacizumab 2.5 MG/0.1ML   Route: Intravitreal, Site: Left Eye   NDC: [phone], Lot: [id number]   Post-op Post injection exam found visual acuity of at least counting fingers. The patient tolerated the procedure well. There were no complications. The patient received written and verbal post procedure care education. Post injection medications included ocuflox.              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  Exudative age-related macular degeneration of left eye with active choroidal neovascularization (HCC) OS with improved vision now left eye is 20/50 panel 20/20 with the onset of treatment the vision was 20/100 with pinhole to 20/60     ICD-10-CM   1. Intermediate stage nonexudative age-related macular degeneration of both eyes  H35.3132 OCT, Retina - OU - Both Eyes    Intravitreal Injection, Pharmacologic Agent - OS - Left Eye    bevacizumab (AVASTIN) SOSY 2.5 mg    2. Adult onset vitelliform macular dystrophy  H35.54     3. Exudative  age-related macular degeneration of left eye with active choroidal neovascularization ([place])  H35.3221       1.  Improved macular findings left eye with less subretinal fluid since onset of therapy August 2022 for wet AMD complicating  pseudo vitelliform adult macular dystrophy.  OS currently at 5-week interval follow-up, repeat injection Avastin OS today  2.  No signs of CNVM OD  3.  Ophthalmic Meds Ordered this visit:  Meds ordered this encounter  Medications   bevacizumab (AVASTIN) SOSY 2.5 mg       Return in about 5 weeks (around 01/13/2021) for dilate, OS, AVASTIN OCT.  There are no Patient Instructions on file for this visit.   Explained the diagnoses, plan, and follow up with the patient and they expressed understanding.  Patient expressed understanding of the importance of proper follow up care.   Clent Demark Alfonzia Woolum M.D. Diseases & Surgery of the Retina and Vitreous Retina & Diabetic [place] 12/09/20     Abbreviations: M myopia (nearsighted); A astigmatism; H hyperopia (farsighted); P presbyopia; Mrx spectacle prescription;  CTL contact lenses; OD right eye; OS left eye; OU both eyes  XT exotropia; ET esotropia; PEK punctate epithelial keratitis; PEE punctate epithelial erosions; DES dry eye syndrome; MGD meibomian gland dysfunction; ATs artificial tears; PFAT's preservative free artificial tears; [place] nuclear sclerotic cataract; PSC posterior subcapsular cataract; ERM epi-retinal membrane; PVD posterior vitreous detachment; RD retinal detachment; DM diabetes mellitus; DR diabetic retinopathy; NPDR non-proliferative diabetic retinopathy; PDR proliferative diabetic retinopathy; CSME clinically significant macular edema; DME diabetic macular edema; dbh dot blot hemorrhages; CWS cotton wool spot; POAG primary open angle glaucoma; C/D cup-to-disc ratio; HVF humphrey visual field; GVF goldmann visual field; OCT optical coherence tomography; IOP intraocular pressure; BRVO Branch  retinal vein occlusion; CRVO central retinal vein occlusion; CRAO central retinal artery occlusion; BRAO branch retinal artery occlusion; RT retinal tear; SB scleral buckle; PPV pars plana vitrectomy; VH Vitreous hemorrhage; PRP panretinal laser photocoagulation; IVK intravitreal kenalog; VMT vitreomacular traction; MH Macular hole;  NVD neovascularization of the disc; NVE neovascularization elsewhere; AREDS age related eye disease study; ARMD age related macular degeneration; POAG primary open angle glaucoma; EBMD epithelial/anterior basement membrane dystrophy; ACIOL anterior chamber intraocular lens; IOL intraocular lens; PCIOL posterior chamber intraocular lens; Phaco/IOL phacoemulsification with intraocular lens placement; [place] photorefractive keratectomy; LASIK laser assisted in situ keratomileusis; HTN hypertension; DM diabetes mellitus; COPD chronic obstructive pulmonary disease

## 2020-12-09 NOTE — Assessment & Plan Note (Signed)
OS with improved vision now left eye is 20/50 panel 20/20 with the onset of treatment the vision was 20/100 with pinhole to 20/60

## 2020-12-09 NOTE — Assessment & Plan Note (Signed)
Patient continues on excellent compliance with CPAP

## 2020-12-16 DIAGNOSIS — Z0000 Encounter for general adult medical examination without abnormal findings: Secondary | ICD-10-CM | POA: Diagnosis not present

## 2020-12-16 DIAGNOSIS — R972 Elevated prostate specific antigen [PSA]: Secondary | ICD-10-CM | POA: Diagnosis not present

## 2020-12-16 DIAGNOSIS — M5441 Lumbago with sciatica, right side: Secondary | ICD-10-CM | POA: Diagnosis not present

## 2020-12-16 DIAGNOSIS — Z6824 Body mass index (BMI) 24.0-24.9, adult: Secondary | ICD-10-CM | POA: Diagnosis not present

## 2020-12-16 DIAGNOSIS — Z86010 Personal history of colonic polyps: Secondary | ICD-10-CM | POA: Diagnosis not present

## 2020-12-16 DIAGNOSIS — R7309 Other abnormal glucose: Secondary | ICD-10-CM | POA: Diagnosis not present

## 2020-12-16 DIAGNOSIS — E785 Hyperlipidemia, unspecified: Secondary | ICD-10-CM | POA: Diagnosis not present

## 2020-12-16 DIAGNOSIS — G4733 Obstructive sleep apnea (adult) (pediatric): Secondary | ICD-10-CM | POA: Diagnosis not present

## 2020-12-16 DIAGNOSIS — N400 Enlarged prostate without lower urinary tract symptoms: Secondary | ICD-10-CM | POA: Diagnosis not present

## 2020-12-21 DIAGNOSIS — Z820 Family history of epilepsy and other diseases of the nervous system: Secondary | ICD-10-CM | POA: Diagnosis not present

## 2020-12-21 DIAGNOSIS — Z8049 Family history of malignant neoplasm of other genital organs: Secondary | ICD-10-CM | POA: Diagnosis not present

## 2020-12-21 DIAGNOSIS — G4733 Obstructive sleep apnea (adult) (pediatric): Secondary | ICD-10-CM | POA: Diagnosis not present

## 2020-12-21 DIAGNOSIS — R030 Elevated blood-pressure reading, without diagnosis of hypertension: Secondary | ICD-10-CM | POA: Diagnosis not present

## 2020-12-21 DIAGNOSIS — E785 Hyperlipidemia, unspecified: Secondary | ICD-10-CM | POA: Diagnosis not present

## 2020-12-23 DIAGNOSIS — G4733 Obstructive sleep apnea (adult) (pediatric): Secondary | ICD-10-CM | POA: Diagnosis not present

## 2020-12-29 ENCOUNTER — Ambulatory Visit: Payer: Medicare HMO | Admitting: Sports Medicine

## 2020-12-29 VITALS — Ht 71.75 in | Wt 180.0 lb

## 2020-12-29 DIAGNOSIS — M25571 Pain in right ankle and joints of right foot: Secondary | ICD-10-CM | POA: Diagnosis not present

## 2020-12-29 DIAGNOSIS — G8929 Other chronic pain: Secondary | ICD-10-CM | POA: Diagnosis not present

## 2020-12-29 DIAGNOSIS — M19071 Primary osteoarthritis, right ankle and foot: Secondary | ICD-10-CM | POA: Diagnosis not present

## 2020-12-29 DIAGNOSIS — R269 Unspecified abnormalities of gait and mobility: Secondary | ICD-10-CM | POA: Diagnosis not present

## 2020-12-29 NOTE — Progress Notes (Signed)
PCP: Hulan Fess, MD  Subjective:   HPI: Joseph Moses is a very pleasant 75 y.o. male here for evaluation of right ankle clicking and discomfort.  Patient states he has been dealing with some right ankle clicking and discomfort over the last few months.  Has any inciting event or trauma.  When he walks with activity he will notice a clicking on the inside of the ankle, just distal to the medial malleolus.  This usually does not cause him any pain.  He does note that he believes he has some arthritis in this area as sometimes it is slightly more swollen than the contralateral ankle.  He also notices some discomfort with changes in the weather such as cold weather or rain.  He was very active playing sports throughout his life and has sprained both of the ankles, but outside of that has not fractured of his foot/ankles.  He does not take anything for the pain as this is not very bothersome in terms of pain.  He just wanted to get the clicking checked out to make sure he did not need any further treatment.  No past medical history on file.  Current Outpatient Medications on File Prior to Visit  Medication Sig Dispense Refill   aspirin EC 81 MG tablet Take 81 mg by mouth daily.     No current facility-administered medications on file prior to visit.    Past Surgical History:  Procedure Laterality Date   CERVICAL DISCECTOMY  2001 &2005   NASAL SEPTUM SURGERY  1968    No Known Allergies  Ht 5' 11.75" (1.822 m)   Wt 180 lb (81.6 kg)   BMI 24.58 kg/m   [facility] 12/29/2020  Frequency of aerobic exercise (# of days/week) 4  Average time in minutes 30  Frequency of strengthening activities (# of days/week) 2    No flowsheet data found.      Objective:  Physical Exam:  Gen: Well-appearing, in no acute distress; non-toxic CV: Regular Rate. Well-perfused. Warm.  Resp: Breathing unlabored on room air; no wheezing. Psych: Fluid speech in conversation; appropriate  affect; normal thought process Neuro: Sensation intact throughout. No gross coordination deficits.  MSK:  - Left ankle/foot: There is no significant TTP throughout the ankle or foot.  Inspection does yield a more prominent right medial malleolus with some very mild soft tissue swelling distal and anterior to the medial malleoli.  The right foot itself does appear to have a more hindfoot valgus appearance compared to the contralateral left ankle upon standing.  There is limited plantar flexion and inversion of the ankle, appears to have mechanical block with these motions in combination.  Otherwise range of motion full in all directions.  Strength is 5/5 in all directions bilateral and single-leg heel raises intact bilaterally.  2/4 posterior tibialis pulse palpated.  Negative anterior drawer, talar tilt, Tinel's test at tarsal tunnel.  Stance and gait analysis: -There is good arch upon sitting, however the longitudinal arch is lost bilaterally, R > L upon standing.  Moderate hindfoot valgus of the right foot.  Evidence of navicular drop right side. -There is excessive pronation of the right foot throughout the stance and preswing phase.     Assessment & Plan:  1. Right ankle pain -patient with likely arthritis of the right ankle, although does not have imaging to confirm this.  There is more prominence and limited range of motion of the right ankle. 2. Gait abnormality -loss of longitudinal arch,  overpronation of right foot with gait  -Discussed the patient's clinical conditions and walk-through all treatment options, discussed he likely has a degree of arthritis throughout the ankle based on her physical exam -Fitted patient today for bilateral green sports insoles with single medium scaphoid pad added to the right insole to prevent hyperpronation -We will see how he does with these corrections to correct his biomechanical gait -He may use ice or topical Voltaren gel if the ankle becomes more  painful, although pain is not his issue -We will follow-up in a few months unless he has issues prior to the  Elba Barman, DO PGY-4, Sports Medicine Fellow [facility]  I observed and examined the patient with the [facility] resident and agree with assessment and plan.  Note reviewed and modified by me. Ila Mcgill, MD

## 2021-01-13 ENCOUNTER — Encounter (INDEPENDENT_AMBULATORY_CARE_PROVIDER_SITE_OTHER): Payer: Medicare HMO | Admitting: Ophthalmology

## 2021-01-13 ENCOUNTER — Encounter (INDEPENDENT_AMBULATORY_CARE_PROVIDER_SITE_OTHER): Payer: Self-pay | Admitting: Ophthalmology

## 2021-01-13 ENCOUNTER — Other Ambulatory Visit: Payer: Self-pay

## 2021-01-13 ENCOUNTER — Ambulatory Visit (INDEPENDENT_AMBULATORY_CARE_PROVIDER_SITE_OTHER): Payer: Medicare HMO | Admitting: Ophthalmology

## 2021-01-13 DIAGNOSIS — H3554 Dystrophies primarily involving the retinal pigment epithelium: Secondary | ICD-10-CM

## 2021-01-13 DIAGNOSIS — Z9989 Dependence on other enabling machines and devices: Secondary | ICD-10-CM | POA: Diagnosis not present

## 2021-01-13 DIAGNOSIS — H353221 Exudative age-related macular degeneration, left eye, with active choroidal neovascularization: Secondary | ICD-10-CM

## 2021-01-13 DIAGNOSIS — G4733 Obstructive sleep apnea (adult) (pediatric): Secondary | ICD-10-CM | POA: Diagnosis not present

## 2021-01-13 MED ORDER — BEVACIZUMAB 2.5 MG/0.1ML IZ SOSY
2.5000 mg | PREFILLED_SYRINGE | INTRAVITREAL | Status: AC | PRN
  Administered 2021-01-13: 09:00:00 2.5 mg via INTRAVITREAL

## 2021-01-13 NOTE — Assessment & Plan Note (Signed)
Bilateral persistent, OS with subretinal fluid component under treatment currently for wet AMD OS

## 2021-01-13 NOTE — Assessment & Plan Note (Signed)
Compliant with CPAP use. 

## 2021-01-13 NOTE — Progress Notes (Signed)
01/13/2021     CHIEF COMPLAINT Patient presents for  Chief Complaint  Patient presents with   Retina Follow Up      HISTORY OF PRESENT ILLNESS: Joseph Moses is a 75 y.o. male who presents to the clinic today for:   HPI     Retina Follow Up           Diagnosis: Wet AMD   Laterality: left eye   Onset: 5 weeks ago   Duration: 5 weeks   Course: stable         Comments   5 weeks dilate OS avastin OCT. Patient states vision is stable and unchanged since last visit. Denies any new floaters or FOL.       Last edited by Laurin Coder on 01/13/2021  8:13 AM.      Referring physician: Hulan Fess, MD [place],  [place] [id number]  HISTORICAL INFORMATION:   Selected notes from the MEDICAL RECORD NUMBER       CURRENT MEDICATIONS: No current outpatient medications on file. (Ophthalmic Drugs)   No current facility-administered medications for this visit. (Ophthalmic Drugs)   Current Outpatient Medications (Other)  Medication Sig   aspirin EC 81 MG tablet Take 81 mg by mouth daily.   No current facility-administered medications for this visit. (Other)      REVIEW OF SYSTEMS:    ALLERGIES No Known Allergies  PAST MEDICAL HISTORY History reviewed. No pertinent past medical history. Past Surgical History:  Procedure Laterality Date   CERVICAL DISCECTOMY  2001 &2005   NASAL SEPTUM SURGERY  1968    FAMILY HISTORY History reviewed. No pertinent family history.  SOCIAL HISTORY Social History   Tobacco Use   Smoking status: Never   Smokeless tobacco: Never  Vaping Use   Vaping Use: Never used  Substance Use Topics   Alcohol use: Yes    Alcohol/week: 1.0 standard drink    Types: 1 Cans of beer per week   Drug use: No         OPHTHALMIC EXAM:  Base Eye Exam     Visual Acuity (ETDRS)       Right Left   Dist cc 20/20 -1 20/30    Correction: Glasses         Tonometry (Tonopen, 8:18 AM)       Right Left    Pressure 16 18         Pupils       Pupils Dark Light APD   Right PERRL 4 3 None   Left PERRL 4 3 None         Extraocular Movement       Right Left    Full Full         Neuro/Psych     Oriented x3: Yes   Mood/Affect: Normal         Dilation     Left eye: 1.0% Mydriacyl, 2.5% Phenylephrine @ 8:18 AM           Slit Lamp and Fundus Exam     External Exam       Right Left   External Normal Normal         Slit Lamp Exam       Right Left   Lids/Lashes Normal Normal   Conjunctiva/Sclera White and quiet White and quiet   Cornea Clear Clear   Anterior Chamber Deep and quiet Deep and quiet   Iris Round and reactive  Round and reactive   Lens Posterior chamber intraocular lens, trace PCO Posterior chamber intraocular lens, 2+ Posterior capsular opacification   Anterior Vitreous Normal Normal         Fundus Exam       Right Left   Posterior Vitreous  Posterior vitreous detachment   Disc  Normal   C/D Ratio  0.45   Macula  Intermediate age related macular degeneration, large subfoveal drusenoid deposit, vitelliform like   Vessels  Normal   Periphery  Normal, along inferotemporal arcade, incidental myelinated nerve fibers no change            IMAGING AND PROCEDURES  Imaging and Procedures for 01/13/21  Intravitreal Injection, Pharmacologic Agent - OS - Left Eye       Time Out 01/13/2021. 8:45 AM. Confirmed correct patient, procedure, site, and patient consented.   Anesthesia Topical anesthesia was used. Anesthetic medications included Lidocaine 4%.   Procedure Preparation included Tobramycin 0.3%, 10% betadine to eyelids, 5% betadine to ocular surface. A 30 gauge needle was used.   Injection: 2.5 mg bevacizumab 2.5 MG/0.1ML   Route: Intravitreal, Site: Left Eye   NDC: [phone], Lot: [id number]   Post-op Post injection exam found visual acuity of at least counting fingers. The patient tolerated the procedure well. There were no  complications. The patient received written and verbal post procedure care education. Post injection medications included ocuflox.      OCT, Retina - OU - Both Eyes       Right Eye Quality was good. Scan locations included subfoveal. Central Foveal Thickness: 443. Progression has been stable. Findings include abnormal foveal contour, subretinal hyper-reflective material.   Left Eye Quality was good. Scan locations included subfoveal. Central Foveal Thickness: 369. Progression has improved. Findings include abnormal foveal contour, subretinal hyper-reflective material.   Notes Large subfoveal drusenoid deposit in the macular region with a small perifoveal operculum in the vitreous but no definable PVD.  Does not appear to have vitreomacular traction  OS with subfoveal drusenoid deposit also large but also with a collection of serous subretinal fluid suggestive of wet AMD.   May not have significant vitreal macular traction we will continue to monitor and follow  Small perifoveal operculum present but no clear definable PVD by OCT as seen on clinical exam             ASSESSMENT/PLAN:  Obstructive sleep apnea on CPAP Compliant with CPAP use  Adult onset vitelliform macular dystrophy Bilateral persistent, OS with subretinal fluid component under treatment currently for wet AMD OS     ICD-10-CM   1. Exudative age-related macular degeneration of left eye with active choroidal neovascularization (HCC)  H35.3221 Intravitreal Injection, Pharmacologic Agent - OS - Left Eye    OCT, Retina - OU - Both Eyes    bevacizumab (AVASTIN) SOSY 2.5 mg    2. Obstructive sleep apnea on CPAP  G47.33    Z99.89     3. Adult onset vitelliform macular dystrophy  H35.54       1.  OS with persistent subretinal fluid at around the region of subretinal hyper reflective material in a drusenoid fashion similar to adult vitelliform macular dystrophy.  Repeat intravitreal Avastin today at the interval of  5 weeks and follow-up again in 5 weeks 2.  May consider use of antivegF, intravitreal Eylea.  Patient notified of high co-pay costs given his managed care plan.  Encouraged to apply the [employer]  3.  Ophthalmic Meds Ordered  this visit:  Meds ordered this encounter  Medications   bevacizumab (AVASTIN) SOSY 2.5 mg       Return in about 5 weeks (around 02/17/2021) for dilate, OS, AVASTIN OCT.  There are no Patient Instructions on file for this visit.   Explained the diagnoses, plan, and follow up with the patient and they expressed understanding.  Patient expressed understanding of the importance of proper follow up care.   Clent Demark Tamila Gaulin M.D. Diseases & Surgery of the Retina and Vitreous Retina & Diabetic [place] 01/13/21     Abbreviations: M myopia (nearsighted); A astigmatism; H hyperopia (farsighted); P presbyopia; Mrx spectacle prescription;  CTL contact lenses; OD right eye; OS left eye; OU both eyes  XT exotropia; ET esotropia; PEK punctate epithelial keratitis; PEE punctate epithelial erosions; DES dry eye syndrome; MGD meibomian gland dysfunction; ATs artificial tears; PFAT's preservative free artificial tears; [place] nuclear sclerotic cataract; PSC posterior subcapsular cataract; ERM epi-retinal membrane; PVD posterior vitreous detachment; RD retinal detachment; DM diabetes mellitus; DR diabetic retinopathy; NPDR non-proliferative diabetic retinopathy; PDR proliferative diabetic retinopathy; CSME clinically significant macular edema; DME diabetic macular edema; dbh dot blot hemorrhages; CWS cotton wool spot; POAG primary open angle glaucoma; C/D cup-to-disc ratio; HVF humphrey visual field; GVF goldmann visual field; OCT optical coherence tomography; IOP intraocular pressure; BRVO Branch retinal vein occlusion; CRVO central retinal vein occlusion; CRAO central retinal artery occlusion; BRAO branch retinal artery occlusion; RT retinal tear; SB scleral buckle;  PPV pars plana vitrectomy; VH Vitreous hemorrhage; PRP panretinal laser photocoagulation; IVK intravitreal kenalog; VMT vitreomacular traction; MH Macular hole;  NVD neovascularization of the disc; NVE neovascularization elsewhere; AREDS age related eye disease study; ARMD age related macular degeneration; POAG primary open angle glaucoma; EBMD epithelial/anterior basement membrane dystrophy; ACIOL anterior chamber intraocular lens; IOL intraocular lens; PCIOL posterior chamber intraocular lens; Phaco/IOL phacoemulsification with intraocular lens placement; [place] photorefractive keratectomy; LASIK laser assisted in situ keratomileusis; HTN hypertension; DM diabetes mellitus; COPD chronic obstructive pulmonary disease

## 2021-02-17 ENCOUNTER — Other Ambulatory Visit: Payer: Self-pay

## 2021-02-17 ENCOUNTER — Encounter (INDEPENDENT_AMBULATORY_CARE_PROVIDER_SITE_OTHER): Payer: Self-pay | Admitting: Ophthalmology

## 2021-02-17 ENCOUNTER — Ambulatory Visit (INDEPENDENT_AMBULATORY_CARE_PROVIDER_SITE_OTHER): Payer: Medicare HMO | Admitting: Ophthalmology

## 2021-02-17 DIAGNOSIS — H353132 Nonexudative age-related macular degeneration, bilateral, intermediate dry stage: Secondary | ICD-10-CM

## 2021-02-17 DIAGNOSIS — H3554 Dystrophies primarily involving the retinal pigment epithelium: Secondary | ICD-10-CM | POA: Diagnosis not present

## 2021-02-17 DIAGNOSIS — H353221 Exudative age-related macular degeneration, left eye, with active choroidal neovascularization: Secondary | ICD-10-CM

## 2021-02-17 MED ORDER — BEVACIZUMAB 2.5 MG/0.1ML IZ SOSY
2.5000 mg | PREFILLED_SYRINGE | INTRAVITREAL | Status: AC | PRN
  Administered 2021-02-17: 10:00:00 2.5 mg via INTRAVITREAL

## 2021-02-17 NOTE — Assessment & Plan Note (Signed)

## 2021-02-17 NOTE — Assessment & Plan Note (Signed)
OS, today at interval of examination of 5 weeks, continued improvement in the subfoveal lesion with less subretinal fluid as compared to onset of therapy and shorter height of lesion.  Large subfoveal drusenoid vitelliform type lesion continues to remain

## 2021-02-17 NOTE — Progress Notes (Signed)
02/17/2021     CHIEF COMPLAINT Patient presents for  Chief Complaint  Patient presents with   Retina Follow Up      HISTORY OF PRESENT ILLNESS: Joseph Moses is a 76 y.o. male who presents to the clinic today for:   HPI     Retina Follow Up           Diagnosis: Wet AMD   Laterality: left eye   Onset: 5 weeks ago   Severity: mild   Duration: 5 weeks   Course: stable         Comments   5 week for dilate OS, Avastin OS and OCT  Pt states VA OU stable since last visit. Pt denies FOL, floaters, or ocular pain OU.        Last edited by Kendra Opitz, COA on 02/17/2021  8:50 AM.      Referring physician: Hulan Fess, MD [place],  [place] [address]  HISTORICAL INFORMATION:   Selected notes from the MEDICAL RECORD NUMBER       CURRENT MEDICATIONS: No current outpatient medications on file. (Ophthalmic Drugs)   No current facility-administered medications for this visit. (Ophthalmic Drugs)   Current Outpatient Medications (Other)  Medication Sig   aspirin EC 81 MG tablet Take 81 mg by mouth daily.   No current facility-administered medications for this visit. (Other)      REVIEW OF SYSTEMS:    ALLERGIES No Known Allergies  PAST MEDICAL HISTORY History reviewed. No pertinent past medical history. Past Surgical History:  Procedure Laterality Date   CERVICAL DISCECTOMY  2001 &2005   NASAL SEPTUM SURGERY  1968    FAMILY HISTORY History reviewed. No pertinent family history.  SOCIAL HISTORY Social History   Tobacco Use   Smoking status: Never   Smokeless tobacco: Never  Vaping Use   Vaping Use: Never used  Substance Use Topics   Alcohol use: Yes    Alcohol/week: 1.0 standard drink    Types: 1 Cans of beer per week   Drug use: No         OPHTHALMIC EXAM:  Base Eye Exam     Visual Acuity (ETDRS)       Right Left   Dist cc 20/20 20/25 +2         Tonometry (Tonopen, 8:55 AM)       Right Left    Pressure 13 15         Pupils       Pupils Dark Light Shape React APD   Right PERRL 4 3 Round Brisk None   Left PERRL 4 3 Round Brisk None         Visual Fields       Left Right    Full Full         Extraocular Movement       Right Left    Full, Ortho Full, Ortho         Neuro/Psych     Oriented x3: Yes   Mood/Affect: Normal         Dilation     Left eye: 1.0% Mydriacyl, 2.5% Phenylephrine @ 8:55 AM           Slit Lamp and Fundus Exam     External Exam       Right Left   External Normal Normal         Slit Lamp Exam       Right  Left   Lids/Lashes Normal Normal   Conjunctiva/Sclera White and quiet White and quiet   Cornea Clear Clear   Anterior Chamber Deep and quiet Deep and quiet   Iris Round and reactive Round and reactive   Lens Posterior chamber intraocular lens, trace PCO Posterior chamber intraocular lens, 2+ Posterior capsular opacification   Anterior Vitreous Normal Normal         Fundus Exam       Right Left   Posterior Vitreous  Posterior vitreous detachment   Disc  Normal   C/D Ratio  0.45   Macula  Intermediate age related macular degeneration, large subfoveal drusenoid deposit, vitelliform like   Vessels  Normal   Periphery  Normal, along inferotemporal arcade, incidental myelinated nerve fibers no change            IMAGING AND PROCEDURES  Imaging and Procedures for 02/17/21  OCT, Retina - OU - Both Eyes       Right Eye Quality was good. Scan locations included subfoveal. Central Foveal Thickness: 436. Progression has been stable. Findings include abnormal foveal contour, subretinal hyper-reflective material.   Left Eye Quality was good. Scan locations included subfoveal. Central Foveal Thickness: 365. Progression has improved. Findings include abnormal foveal contour, subretinal hyper-reflective material.   Notes Large subfoveal drusenoid deposit in the macular region with a small perifoveal operculum in  the vitreous but no definable PVD.  Does not appear to have vitreomacular traction  OS with subfoveal drusenoid deposit also large but also with a collection of serous subretinal fluid suggestive of wet AMD.   May not have significant vitreal macular traction we will continue to monitor and follow, much less subretinal fluid and and shorter linear length at the base of the lesion as well as shorter height of the lesion post onset of therapy with antivegF OS  Small perifoveal operculum present but no clear definable PVD by OCT as seen on clinical exam     Intravitreal Injection, Pharmacologic Agent - OS - Left Eye       Time Out 02/17/2021. 9:31 AM. Confirmed correct patient, procedure, site, and patient consented.   Anesthesia Topical anesthesia was used. Anesthetic medications included Lidocaine 4%.   Procedure Preparation included Tobramycin 0.3%, 10% betadine to eyelids, 5% betadine to ocular surface. A 30 gauge needle was used.   Injection: 2.5 mg bevacizumab 2.5 MG/0.1ML   Route: Intravitreal, Site: Left Eye   NDC: [phone], Lot: [id number]   Post-op Post injection exam found visual acuity of at least counting fingers. The patient tolerated the procedure well. There were no complications. The patient received written and verbal post procedure care education. Post injection medications included ocuflox.              ASSESSMENT/PLAN:  Adult onset vitelliform macular dystrophy The nature of age--related macular degeneration was discussed with the patient as well as the distinction between dry and wet types. Checking an Amsler Grid daily with advice to return immediately should a distortion develop, was given to the patient. The patient 's smoking status now and in the past was determined and advice based on the AREDS study was provided regarding the consumption of antioxidant supplements. AREDS 2 vitamin formulation was recommended. Consumption of dark leafy vegetables and  fresh fruits of various colors was recommended. Treatment modalities for wet macular degeneration particularly the use of intravitreal injections of anti-blood vessel growth factors was discussed with the patient. Avastin, Lucentis, and Eylea are the available options. On occasion, therapy  includes the use of photodynamic therapy and thermal laser. Stressed to the patient do not rub eyes.  Patient was advised to check Amsler Grid daily and return immediately if changes are noted. Instructions on using the grid were given to the patient. All patient questions were answered.  Exudative age-related macular degeneration of left eye with active choroidal neovascularization (HCC) OS, today at interval of examination of 5 weeks, continued improvement in the subfoveal lesion with less subretinal fluid as compared to onset of therapy and shorter height of lesion.  Large subfoveal drusenoid vitelliform type lesion continues to remain  Intermediate stage nonexudative age-related macular degeneration of both eyes No sign of CNVM by OCT OD     ICD-10-CM   1. Exudative age-related macular degeneration of left eye with active choroidal neovascularization (HCC)  H35.3221 OCT, Retina - OU - Both Eyes    Intravitreal Injection, Pharmacologic Agent - OS - Left Eye    bevacizumab (AVASTIN) SOSY 2.5 mg    2. Adult onset vitelliform macular dystrophy  H35.54     3. Intermediate stage nonexudative age-related macular degeneration of both eyes  H35.3132       1.  OD, stable lesion by OCT no sign of CNVM  2.  OS, measurable improvement in lesion size and less subretinal fluid as compared to onset of therapy.  Reviewed with patient.  Currently at 5-week interval.  We will maintain 5-week interval and extend look to extend to 6 and 7 weeks over the coming time.  Patient has a conflict for the 6-week interval so thus we will see the patient in 5 weeks  3.  Ophthalmic Meds Ordered this visit:  Meds ordered this  encounter  Medications   bevacizumab (AVASTIN) SOSY 2.5 mg       Return in about 5 weeks (around 03/24/2021) for dilate, OS, AVASTIN OCT.  There are no Patient Instructions on file for this visit.   Explained the diagnoses, plan, and follow up with the patient and they expressed understanding.  Patient expressed understanding of the importance of proper follow up care.   Clent Demark Velencia Lenart M.D. Diseases & Surgery of the Retina and Vitreous Retina & Diabetic [place] 02/17/21     Abbreviations: M myopia (nearsighted); A astigmatism; H hyperopia (farsighted); P presbyopia; Mrx spectacle prescription;  CTL contact lenses; OD right eye; OS left eye; OU both eyes  XT exotropia; ET esotropia; PEK punctate epithelial keratitis; PEE punctate epithelial erosions; DES dry eye syndrome; MGD meibomian gland dysfunction; ATs artificial tears; PFAT's preservative free artificial tears; [place] nuclear sclerotic cataract; PSC posterior subcapsular cataract; ERM epi-retinal membrane; PVD posterior vitreous detachment; RD retinal detachment; DM diabetes mellitus; DR diabetic retinopathy; NPDR non-proliferative diabetic retinopathy; PDR proliferative diabetic retinopathy; CSME clinically significant macular edema; DME diabetic macular edema; dbh dot blot hemorrhages; CWS cotton wool spot; POAG primary open angle glaucoma; C/D cup-to-disc ratio; HVF humphrey visual field; GVF goldmann visual field; OCT optical coherence tomography; IOP intraocular pressure; BRVO Branch retinal vein occlusion; CRVO central retinal vein occlusion; CRAO central retinal artery occlusion; BRAO branch retinal artery occlusion; RT retinal tear; SB scleral buckle; PPV pars plana vitrectomy; VH Vitreous hemorrhage; PRP panretinal laser photocoagulation; IVK intravitreal kenalog; VMT vitreomacular traction; MH Macular hole;  NVD neovascularization of the disc; NVE neovascularization elsewhere; AREDS age related eye disease study; ARMD age related  macular degeneration; POAG primary open angle glaucoma; EBMD epithelial/anterior basement membrane dystrophy; ACIOL anterior chamber intraocular lens; IOL intraocular lens; PCIOL posterior chamber intraocular lens;  Phaco/IOL phacoemulsification with intraocular lens placement; [place] photorefractive keratectomy; LASIK laser assisted in situ keratomileusis; HTN hypertension; DM diabetes mellitus; COPD chronic obstructive pulmonary disease

## 2021-02-17 NOTE — Assessment & Plan Note (Signed)
No sign of CNVM by OCT OD

## 2021-03-29 DIAGNOSIS — G4733 Obstructive sleep apnea (adult) (pediatric): Secondary | ICD-10-CM | POA: Diagnosis not present

## 2021-04-01 ENCOUNTER — Encounter (INDEPENDENT_AMBULATORY_CARE_PROVIDER_SITE_OTHER): Payer: Medicare HMO | Admitting: Ophthalmology

## 2021-04-05 ENCOUNTER — Other Ambulatory Visit: Payer: Self-pay

## 2021-04-05 ENCOUNTER — Ambulatory Visit (INDEPENDENT_AMBULATORY_CARE_PROVIDER_SITE_OTHER): Payer: Medicare HMO | Admitting: Ophthalmology

## 2021-04-05 ENCOUNTER — Encounter (INDEPENDENT_AMBULATORY_CARE_PROVIDER_SITE_OTHER): Payer: Self-pay | Admitting: Ophthalmology

## 2021-04-05 DIAGNOSIS — H3554 Dystrophies primarily involving the retinal pigment epithelium: Secondary | ICD-10-CM | POA: Diagnosis not present

## 2021-04-05 DIAGNOSIS — H353221 Exudative age-related macular degeneration, left eye, with active choroidal neovascularization: Secondary | ICD-10-CM

## 2021-04-05 MED ORDER — BEVACIZUMAB 2.5 MG/0.1ML IZ SOSY
2.5000 mg | PREFILLED_SYRINGE | INTRAVITREAL | Status: AC | PRN
  Administered 2021-04-05: 2.5 mg via INTRAVITREAL

## 2021-04-05 NOTE — Progress Notes (Signed)
04/05/2021     CHIEF COMPLAINT Patient presents for  Chief Complaint  Patient presents with   Macular Degeneration      HISTORY OF PRESENT ILLNESS: Joseph Moses is a 76 y.o. male who presents to the clinic today for:   HPI   6 weeks for dilate OS AVASTIN OCT. Pt states no changes in vision. Pt denies floaters and FOL. Pt states no changes in medical history.  Last edited by Silvestre Moment on 04/05/2021  8:34 AM.      Referring physician: Hulan Fess, MD [place],  [place] [address]  HISTORICAL INFORMATION:   Selected notes from the MEDICAL RECORD NUMBER       CURRENT MEDICATIONS: No current outpatient medications on file. (Ophthalmic Drugs)   No current facility-administered medications for this visit. (Ophthalmic Drugs)   Current Outpatient Medications (Other)  Medication Sig   aspirin EC 81 MG tablet Take 81 mg by mouth daily.   No current facility-administered medications for this visit. (Other)      REVIEW OF SYSTEMS: ROS   Negative for: Constitutional, Gastrointestinal, Neurological, Skin, Genitourinary, Musculoskeletal, HENT, Endocrine, Cardiovascular, Eyes, Respiratory, Psychiatric, Allergic/Imm, Heme/Lymph Last edited by Silvestre Moment on 04/05/2021  8:34 AM.       ALLERGIES No Known Allergies  PAST MEDICAL HISTORY No past medical history on file. Past Surgical History:  Procedure Laterality Date   CERVICAL DISCECTOMY  2001 &2005   NASAL SEPTUM SURGERY  1968    FAMILY HISTORY No family history on file.  SOCIAL HISTORY Social History   Tobacco Use   Smoking status: Never   Smokeless tobacco: Never  Vaping Use   Vaping Use: Never used  Substance Use Topics   Alcohol use: Yes    Alcohol/week: 1.0 standard drink    Types: 1 Cans of beer per week   Drug use: No         OPHTHALMIC EXAM:  Base Eye Exam     Visual Acuity (ETDRS)       Right Left   Dist [place] 20/30 -2 20/40 -2   Dist ph  NI 20/30         Tonometry  (Tonopen, 8:43 AM)       Right Left   Pressure 10 9         Pupils       Pupils Dark Light Shape React APD   Right PERRL 3 2 Round Brisk None   Left PERRL 3 2 Round Brisk None         Visual Fields       Left Right    Full Full         Extraocular Movement       Right Left    Full, Ortho Full, Ortho         Neuro/Psych     Oriented x3: Yes   Mood/Affect: Normal         Dilation     Both eyes: 1.0% Mydriacyl, 2.5% Phenylephrine @ 8:43 AM           Slit Lamp and Fundus Exam     External Exam       Right Left   External Normal Normal         Slit Lamp Exam       Right Left   Lids/Lashes Normal Normal   Conjunctiva/Sclera White and quiet White and quiet   Cornea Clear Clear   Anterior Chamber Deep  and quiet Deep and quiet   Iris Round and reactive Round and reactive   Lens Posterior chamber intraocular lens, trace PCO Posterior chamber intraocular lens, 2+ Posterior capsular opacification   Anterior Vitreous Normal Normal         Fundus Exam       Right Left   Posterior Vitreous  Posterior vitreous detachment   Disc  Normal   C/D Ratio  0.45   Macula  Intermediate age related macular degeneration, large subfoveal drusenoid deposit, vitelliform like   Vessels  Normal   Periphery  Normal, along inferotemporal arcade, incidental myelinated nerve fibers no change            IMAGING AND PROCEDURES  Imaging and Procedures for 04/05/21  OCT, Retina - OU - Both Eyes       Right Eye Quality was good. Scan locations included subfoveal. Central Foveal Thickness: 439. Progression has been stable. Findings include abnormal foveal contour, subretinal hyper-reflective material.   Left Eye Quality was good. Scan locations included subfoveal. Central Foveal Thickness: 360. Progression has improved. Findings include abnormal foveal contour, subretinal hyper-reflective material.   Notes Large subfoveal drusenoid deposit in the macular  region with a small perifoveal operculum in the vitreous but no definable PVD.  Does not appear to have vitreomacular traction  OS with subfoveal drusenoid deposit also large but also with a collection of serous subretinal fluid suggestive of wet AMD.   May not have significant vitreal macular traction we will continue to monitor and follow, much less subretinal fluid and and shorter linear length at the base of the lesion as well as shorter height of the lesion post onset of therapy with antivegF OS  Small perifoveal operculum present but no clear definable PVD by OCT as seen on clinical exam     Intravitreal Injection, Pharmacologic Agent - OS - Left Eye       Time Out 04/05/2021. 9:26 AM. Confirmed correct patient, procedure, site, and patient consented.   Anesthesia Topical anesthesia was used. Anesthetic medications included Lidocaine 4%.   Procedure Preparation included Tobramycin 0.3%, 10% betadine to eyelids, 5% betadine to ocular surface. A 30 gauge needle was used.   Injection: 2.5 mg bevacizumab 2.5 MG/0.1ML   Route: Intravitreal, Site: Left Eye   NDC: [phone], Lot: [id number]   Post-op Post injection exam found visual acuity of at least counting fingers. The patient tolerated the procedure well. There were no complications. The patient received written and verbal post procedure care education. Post injection medications included ocuflox.              ASSESSMENT/PLAN:  Exudative age-related macular degeneration of left eye with active choroidal neovascularization (HCC) Improved, stable at 6 week interval, will repeat injection intravitreal Avastin OS today but will attempt to change to [facility] next as patient has been approved by "good days"  Adult onset vitelliform macular dystrophy OD stable over time, no interval change since August 2022, with no evidence of VM traction.     ICD-10-CM   1. Exudative age-related macular degeneration of left eye with active  choroidal neovascularization (HCC)  H35.3221 OCT, Retina - OU - Both Eyes    Intravitreal Injection, Pharmacologic Agent - OS - Left Eye    bevacizumab (AVASTIN) SOSY 2.5 mg    2. Adult onset vitelliform macular dystrophy  H35.54       1.  OS, with persistence of large subfoveal drusenoid deposit and small serous retinal detachment associated with this but nonetheless  slightly improved particularly as compared to onset August 2022.  Repeat injection today at 6-week interval and consider change of medication to [facility] next to look for enhanced resolution  2.  OD, persistent subfoveal drusenoid deposit of adult vitelliform macular dystrophy.  No signs of interval change.  No signs of VM traction  3.  Ophthalmic Meds Ordered this visit:  Meds ordered this encounter  Medications   bevacizumab (AVASTIN) SOSY 2.5 mg       Return in about 6 weeks (around 05/17/2021) for dilate, OS, EYLEA OCT, this is a change.  There are no Patient Instructions on file for this visit.   Explained the diagnoses, plan, and follow up with the patient and they expressed understanding.  Patient expressed understanding of the importance of proper follow up care.   Clent Demark Younique Casad M.D. Diseases & Surgery of the Retina and Vitreous Retina & Diabetic [place] 04/05/21     Abbreviations: M myopia (nearsighted); A astigmatism; H hyperopia (farsighted); P presbyopia; Mrx spectacle prescription;  CTL contact lenses; OD right eye; OS left eye; OU both eyes  XT exotropia; ET esotropia; PEK punctate epithelial keratitis; PEE punctate epithelial erosions; DES dry eye syndrome; MGD meibomian gland dysfunction; ATs artificial tears; PFAT's preservative free artificial tears; [place] nuclear sclerotic cataract; PSC posterior subcapsular cataract; ERM epi-retinal membrane; PVD posterior vitreous detachment; RD retinal detachment; DM diabetes mellitus; DR diabetic retinopathy; NPDR non-proliferative diabetic retinopathy; PDR  proliferative diabetic retinopathy; CSME clinically significant macular edema; DME diabetic macular edema; dbh dot blot hemorrhages; CWS cotton wool spot; POAG primary open angle glaucoma; C/D cup-to-disc ratio; HVF humphrey visual field; GVF goldmann visual field; OCT optical coherence tomography; IOP intraocular pressure; BRVO Branch retinal vein occlusion; CRVO central retinal vein occlusion; CRAO central retinal artery occlusion; BRAO branch retinal artery occlusion; RT retinal tear; SB scleral buckle; PPV pars plana vitrectomy; VH Vitreous hemorrhage; PRP panretinal laser photocoagulation; IVK intravitreal kenalog; VMT vitreomacular traction; MH Macular hole;  NVD neovascularization of the disc; NVE neovascularization elsewhere; AREDS age related eye disease study; ARMD age related macular degeneration; POAG primary open angle glaucoma; EBMD epithelial/anterior basement membrane dystrophy; ACIOL anterior chamber intraocular lens; IOL intraocular lens; PCIOL posterior chamber intraocular lens; Phaco/IOL phacoemulsification with intraocular lens placement; [place] photorefractive keratectomy; LASIK laser assisted in situ keratomileusis; HTN hypertension; DM diabetes mellitus; COPD chronic obstructive pulmonary disease

## 2021-04-05 NOTE — Assessment & Plan Note (Signed)
Improved, stable at 6 week interval, will repeat injection intravitreal Avastin OS today but will attempt to change to [facility] next as patient has been approved by "good days" ?

## 2021-04-05 NOTE — Assessment & Plan Note (Signed)
OD stable over time, no interval change since August 2022, with no evidence of VM traction. ?

## 2021-05-06 ENCOUNTER — Encounter: Payer: Self-pay | Admitting: Physician Assistant

## 2021-05-06 ENCOUNTER — Ambulatory Visit: Payer: Medicare HMO | Admitting: Physician Assistant

## 2021-05-06 DIAGNOSIS — Z1283 Encounter for screening for malignant neoplasm of skin: Secondary | ICD-10-CM | POA: Diagnosis not present

## 2021-05-06 NOTE — Progress Notes (Addendum)
? ?  Follow-Up Visit ?  ?Subjective  ?Joseph Moses is a 76 y.o. male who presents for the following: Annual Exam (No new concerns- No personal or family history of non mole skin cancer or melanoma. ). ? ? ?The following portions of the chart were reviewed this encounter and updated as appropriate:  Tobacco  Allergies  Meds  Problems  Med Hx  Surg Hx  Fam Hx   ?  ? ?Objective  ?Well appearing patient in no apparent distress; mood and affect are within normal limits. ? ?A full examination was performed including scalp, head, eyes, ears, nose, lips, neck, chest, axillae, abdomen, back, buttocks, bilateral upper extremities, bilateral lower extremities, hands, feet, fingers, toes, fingernails, and toenails. All findings within normal limits unless otherwise noted below. ? ?No atypical nevi or signs of NMSC noted at the time of the visit.  ? ? ?Assessment & Plan  ?Encounter for screening for malignant neoplasm of skin ? ?Yearly skin check ? ? ? ?I, Lallie Strahm, PA-C, have reviewed all documentation's for this visit.  The documentation on 05/06/21 for the exam, diagnosis, procedures and orders are all accurate and complete. ?

## 2021-05-17 ENCOUNTER — Ambulatory Visit (INDEPENDENT_AMBULATORY_CARE_PROVIDER_SITE_OTHER): Payer: Medicare HMO | Admitting: Ophthalmology

## 2021-05-17 ENCOUNTER — Encounter (INDEPENDENT_AMBULATORY_CARE_PROVIDER_SITE_OTHER): Payer: Self-pay | Admitting: Ophthalmology

## 2021-05-17 DIAGNOSIS — H353221 Exudative age-related macular degeneration, left eye, with active choroidal neovascularization: Secondary | ICD-10-CM

## 2021-05-17 DIAGNOSIS — H353132 Nonexudative age-related macular degeneration, bilateral, intermediate dry stage: Secondary | ICD-10-CM

## 2021-05-17 DIAGNOSIS — H3554 Dystrophies primarily involving the retinal pigment epithelium: Secondary | ICD-10-CM

## 2021-05-17 MED ORDER — AFLIBERCEPT 2MG/0.05ML IZ SOLN FOR KALEIDOSCOPE
2.0000 mg | INTRAVITREAL | Status: AC | PRN
  Administered 2021-05-17: 2 mg via INTRAVITREAL

## 2021-05-17 NOTE — Assessment & Plan Note (Signed)
Present since initial evaluation August 2022 ?

## 2021-05-17 NOTE — Progress Notes (Signed)
? ? ?05/17/2021 ? ?  ? ?CHIEF COMPLAINT ?Patient presents for  ?Chief Complaint  ?Patient presents with  ? Macular Degeneration  ? ? ? ? ?HISTORY OF PRESENT ILLNESS: ?Joseph Moses is a 76 y.o. male who presents to the clinic today for:  ? ?HPI   ?6 weeks dilate OS, Eylea OS, OCT. ?Patient states "I think there's been some change but only because of it being 6 weeks out and it being time for my injection.: ?Last edited by Laurin Coder on 05/17/2021  8:01 AM.  ?  ? ? ?Referring physician: ?Hulan Fess, MD ?[facility] ?[place],  [place] [address] ? ?HISTORICAL INFORMATION:  ? ?Selected notes from the [facility] ?  ?   ? ?CURRENT MEDICATIONS: ?No current outpatient medications on file. (Ophthalmic Drugs)  ? ?No current facility-administered medications for this visit. (Ophthalmic Drugs)  ? ?Current Outpatient Medications (Other)  ?Medication Sig  ? aspirin EC 81 MG tablet Take 81 mg by mouth daily.  ? ?No current facility-administered medications for this visit. (Other)  ? ? ? ? ?REVIEW OF SYSTEMS: ?ROS   ?Negative for: Constitutional, Gastrointestinal, Neurological, Skin, Genitourinary, Musculoskeletal, HENT, Endocrine, Cardiovascular, Eyes, Respiratory, Psychiatric, Allergic/Imm, Heme/Lymph ?Last edited by Hurman Horn, MD on 05/17/2021  8:21 AM.  ?  ? ? ? ?ALLERGIES ?No Known Allergies ? ?PAST MEDICAL HISTORY ?History reviewed. No pertinent past medical history. ?Past Surgical History:  ?Procedure Laterality Date  ? CERVICAL DISCECTOMY  2001 &2005  ? NASAL SEPTUM SURGERY  1968  ? ? ?FAMILY HISTORY ?History reviewed. No pertinent family history. ? ?SOCIAL HISTORY ?Social History  ? ?Tobacco Use  ? Smoking status: Never  ? Smokeless tobacco: Never  ?Vaping Use  ? Vaping Use: Never used  ?Substance Use Topics  ? Alcohol use: Yes  ?  Alcohol/week: 1.0 standard drink  ?  Types: 1 Cans of beer per week  ? Drug use: No  ? ?  ? ?  ? ?OPHTHALMIC EXAM: ? ?Base Eye Exam   ? ? Visual Acuity (ETDRS)   ? ?    Right Left  ? Dist cc 20/25 20/25 -1  ? ? Correction: Glasses  ? ?  ?  ? ? Tonometry (Tonopen, 8:04 AM)   ? ?   Right Left  ? Pressure 19 18  ? ?  ?  ? ? Pupils   ? ?   Pupils Dark Light APD  ? Right PERRL 4 3 None  ? Left PERRL 4 3 None  ? ?  ?  ? ? Extraocular Movement   ? ?   Right Left  ?  Full Full  ? ?  ?  ? ? Neuro/Psych   ? ? Oriented x3: Yes  ? Mood/Affect: Normal  ? ?  ?  ? ? Dilation   ? ? Left eye: 1.0% Mydriacyl, 2.5% Phenylephrine @ 8:04 AM  ? ?  ?  ? ?  ? ?Slit Lamp and Fundus Exam   ? ? External Exam   ? ?   Right Left  ? External Normal Normal  ? ?  ?  ? ? Slit Lamp Exam   ? ?   Right Left  ? Lids/Lashes Normal Normal  ? Conjunctiva/Sclera White and quiet White and quiet  ? Cornea Clear Clear  ? Anterior Chamber Deep and quiet Deep and quiet  ? Iris Round and reactive Round and reactive  ? Lens Posterior chamber intraocular lens, trace PCO Posterior  chamber intraocular lens, 2+ Posterior capsular opacification  ? Anterior Vitreous Normal Normal  ? ?  ?  ? ? Fundus Exam   ? ?   Right Left  ? Posterior Vitreous  Posterior vitreous detachment  ? Disc  Normal  ? C/D Ratio  0.45  ? Macula  Intermediate age related macular degeneration, large subfoveal drusenoid deposit, vitelliform like  ? Vessels  Normal  ? Periphery  Normal, along inferotemporal arcade, incidental myelinated nerve fibers no change  ? ?  ?  ? ?  ? ? ?IMAGING AND PROCEDURES  ?Imaging and Procedures for 05/17/21 ? ?Intravitreal Injection, Pharmacologic Agent - OS - Left Eye   ? ?   ?Time Out ?05/17/2021. 8:37 AM. Confirmed correct patient, procedure, site, and patient consented.  ? ?Anesthesia ?Topical anesthesia was used. Anesthetic medications included Lidocaine 4%.  ? ?Procedure ?Preparation included Tobramycin 0.3%, 10% betadine to eyelids, 5% betadine to ocular surface. A 30 gauge needle was used.  ? ?Injection: ?2 mg aflibercept 2 MG/0.05ML ?  Route: Intravitreal, Site: Left Eye ?  [place]: [id number], Lot: [id number], Waste: 0 mL   ? ?Post-op ?Post injection exam found visual acuity of at least counting fingers. The patient tolerated the procedure well. There were no complications. The patient received written and verbal post procedure care education. Post injection medications included ocuflox.  ? ?  ? ?OCT, Retina - OU - Both Eyes   ? ?   ?Right Eye ?Quality was good. Scan locations included subfoveal. Central Foveal Thickness: 439. Progression has been stable. Findings include abnormal foveal contour, subretinal hyper-reflective material.  ? ?Left Eye ?Quality was good. Scan locations included subfoveal. Central Foveal Thickness: 344. Progression has improved. Findings include abnormal foveal contour, subretinal hyper-reflective material.  ? ?Notes ?Large subfoveal drusenoid deposit in the macular region with a small perifoveal operculum in the vitreous but no definable PVD.  Does not appear to have vitreomacular traction ? ?OSDwith subfoveal drusenoid deposit also large but also with a collection of serous subretinal fluid suggestive of wet AMD.  Overall this area of lucency has diminished over time since onset of therapy August 2022 yet still present. ? ?Overall central thickness of 440 ?m is now decreased to 344 ?m. ? ? May not have significant vitreal macular traction we will continue to monitor and follow, much less subretinal fluid and and shorter linear length at the base of the lesion as well as shorter height of the lesion post onset of therapy with antivegF OS ? ? ?OD large subfoveal drusenoid deposit unchanged with no perifoveal lucencies or subretinal fluid or intraretinal fluid ?Small perifoveal operculum present but no clear definable PVD by OCT as seen on clinical exam ? ?  ? ? ?  ?  ? ?  ?ASSESSMENT/PLAN: ? ?Intermediate stage nonexudative age-related macular degeneration of both eyes ?We will continue to monitor central geographic atrophy OU yet with good acuity. ? ?OD large subfoveal drusenoid deposit, high risk for CNVM  yet no signs of CNVM formation to date and interestingly no decrease in size of this lesion as it is not undergoing treatment ? ?Exudative age-related macular degeneration of left eye with active choroidal neovascularization ([place]) ?OS, less active CNVM since onset of therapy August 2022 also with a decreasing size of subfoveal drusenoid deposit OS we will continue to treat as condition is improving with preserved acuity ? ?Adult onset vitelliform macular dystrophy ?Present since initial evaluation August 2022 ?  ? ?  ICD-10-CM   ?1. Exudative  age-related macular degeneration of left eye with active choroidal neovascularization (HCC)  H35.3221 Intravitreal Injection, Pharmacologic Agent - OS - Left Eye  ?  OCT, Retina - OU - Both Eyes  ?  aflibercept (EYLEA) SOLN 2 mg  ?  ?2. Intermediate stage nonexudative age-related macular degeneration of both eyes  H35.3132   ?  ?3. Adult onset vitelliform macular dystrophy  H35.54   ?  ? ? ?1.  Repeat intravitreal Eylea OS today and maintain interval follow-up again in 5 to 6 weeks ? ?2.  OD will continue to monitor large subfoveal drusenoid deposit ? ?3.  Discussion, regarding newly approved dry AMD medication by pharmaceutical company [employer], not yet to market ? ?Ophthalmic Meds Ordered this visit:  ?Meds ordered this encounter  ?Medications  ? aflibercept (EYLEA) SOLN 2 mg  ? ? ?  ? ?Return in about 5 weeks (around 06/21/2021) for dilate, OS, EYLEA OCT. ? ?There are no Patient Instructions on file for this visit. ? ? ?Explained the diagnoses, plan, and follow up with the patient and they expressed understanding.  Patient expressed understanding of the importance of proper follow up care.  ? ?Clent Demark. Ameliana Brashear M.D. ?Diseases & Surgery of the Retina and Vitreous ?[facility] ?05/17/21 ? ? ? ? ?Abbreviations: ?M myopia (nearsighted); A astigmatism; H hyperopia (farsighted); P presbyopia; Mrx spectacle prescription;  CTL contact lenses; OD right eye; OS left eye;  OU both eyes  XT exotropia; ET esotropia; PEK punctate epithelial keratitis; PEE punctate epithelial erosions; DES dry eye syndrome; MGD meibomian gland dysfunction; ATs artificial tears; PFAT's preservati

## 2021-05-17 NOTE — Assessment & Plan Note (Signed)
OS, less active CNVM since onset of therapy August 2022 also with a decreasing size of subfoveal drusenoid deposit OS we will continue to treat as condition is improving with preserved acuity ?

## 2021-05-17 NOTE — Assessment & Plan Note (Signed)
We will continue to monitor central geographic atrophy OU yet with good acuity. ? ?OD large subfoveal drusenoid deposit, high risk for CNVM yet no signs of CNVM formation to date and interestingly no decrease in size of this lesion as it is not undergoing treatment ?

## 2021-06-18 DIAGNOSIS — G4733 Obstructive sleep apnea (adult) (pediatric): Secondary | ICD-10-CM | POA: Diagnosis not present

## 2021-06-28 ENCOUNTER — Ambulatory Visit (INDEPENDENT_AMBULATORY_CARE_PROVIDER_SITE_OTHER): Payer: Medicare HMO | Admitting: Ophthalmology

## 2021-06-28 ENCOUNTER — Encounter (INDEPENDENT_AMBULATORY_CARE_PROVIDER_SITE_OTHER): Payer: Self-pay | Admitting: Ophthalmology

## 2021-06-28 DIAGNOSIS — H353132 Nonexudative age-related macular degeneration, bilateral, intermediate dry stage: Secondary | ICD-10-CM | POA: Diagnosis not present

## 2021-06-28 DIAGNOSIS — H353221 Exudative age-related macular degeneration, left eye, with active choroidal neovascularization: Secondary | ICD-10-CM

## 2021-06-28 DIAGNOSIS — H3554 Dystrophies primarily involving the retinal pigment epithelium: Secondary | ICD-10-CM | POA: Diagnosis not present

## 2021-06-28 MED ORDER — AFLIBERCEPT 2MG/0.05ML IZ SOLN FOR KALEIDOSCOPE
2.0000 mg | INTRAVITREAL | Status: AC | PRN
  Administered 2021-06-28: 2 mg via INTRAVITREAL

## 2021-06-28 NOTE — Progress Notes (Signed)
06/28/2021     CHIEF COMPLAINT Patient presents for  Chief Complaint  Patient presents with   Macular Degeneration      HISTORY OF PRESENT ILLNESS: Joseph Moses is a 76 y.o. male who presents to the clinic today for:   HPI   5 weeks dilate OS, Eylea OCT OS. Patient reports no changes in vision but states "I have noticed some floaters within the last week or so, as I look one side or the other. I can't tell if it is gnats or the floaters but I think it is the floaters since the injection has been an extra week." Last edited by Laurin Coder on 06/28/2021  8:34 AM.      Referring physician: Hulan Fess, MD [place],  [place] [address]  HISTORICAL INFORMATION:   Selected notes from the [facility]: No current outpatient medications on file. (Ophthalmic Drugs)   No current facility-administered medications for this visit. (Ophthalmic Drugs)   Current Outpatient Medications (Other)  Medication Sig   aspirin EC 81 MG tablet Take 81 mg by mouth daily.   No current facility-administered medications for this visit. (Other)      REVIEW OF SYSTEMS: ROS   Negative for: Constitutional, Gastrointestinal, Neurological, Skin, Genitourinary, Musculoskeletal, HENT, Endocrine, Cardiovascular, Eyes, Respiratory, Psychiatric, Allergic/Imm, Heme/Lymph Last edited by Hurman Horn, MD on 06/28/2021  9:09 AM.       ALLERGIES No Known Allergies  PAST MEDICAL HISTORY History reviewed. No pertinent past medical history. Past Surgical History:  Procedure Laterality Date   CERVICAL DISCECTOMY  2001 &2005   NASAL SEPTUM SURGERY  1968    FAMILY HISTORY History reviewed. No pertinent family history.  SOCIAL HISTORY Social History   Tobacco Use   Smoking status: Never   Smokeless tobacco: Never  Vaping Use   Vaping Use: Never used  Substance Use Topics   Alcohol use: Yes    Alcohol/week: 1.0 standard drink     Types: 1 Cans of beer per week   Drug use: No         OPHTHALMIC EXAM:  Base Eye Exam     Visual Acuity (ETDRS)       Right Left   Dist cc 20/25 20/25    Correction: Glasses         Tonometry (Tonopen, 8:39 AM)       Right Left   Pressure 15 20         Pupils       Pupils Dark Light APD   Right PERRL 4 3 None   Left PERRL 4 3 None         Visual Fields (Counting fingers)       Left Right    Full Full         Extraocular Movement       Right Left    Full Full         Neuro/Psych     Oriented x3: Yes   Mood/Affect: Normal         Dilation     Left eye: 1.0% Mydriacyl, 2.5% Phenylephrine @ 8:39 AM           Slit Lamp and Fundus Exam     External Exam       Right Left   External Normal Normal         Slit Lamp Exam  Right Left   Lids/Lashes Normal Normal   Conjunctiva/Sclera White and quiet White and quiet   Cornea Clear Clear   Anterior Chamber Deep and quiet Deep and quiet   Iris Round and reactive Round and reactive   Lens Posterior chamber intraocular lens, trace PCO Posterior chamber intraocular lens, 2+ Posterior capsular opacification   Anterior Vitreous Normal Normal         Fundus Exam       Right Left   Posterior Vitreous  Posterior vitreous detachment   Disc  Normal   C/D Ratio  0.45   Macula  Intermediate age related macular degeneration, large subfoveal drusenoid deposit, vitelliform like   Vessels  Normal   Periphery  Normal, along inferotemporal arcade, incidental myelinated nerve fibers no change            IMAGING AND PROCEDURES  Imaging and Procedures for 06/28/21  Intravitreal Injection, Pharmacologic Agent - OS - Left Eye       Time Out 06/28/2021. 9:15 AM. Confirmed correct patient, procedure, site, and patient consented.   Anesthesia Topical anesthesia was used. Anesthetic medications included Lidocaine 4%.   Procedure Preparation included Tobramycin 0.3%, 10% betadine to  eyelids, 5% betadine to ocular surface. A 30 gauge needle was used.   Injection: 2 mg aflibercept 2 MG/0.05ML   Route: Intravitreal, Site: Left Eye   [place]: [id number], Lot: [id number], Waste: 0 mL   Post-op Post injection exam found visual acuity of at least counting fingers. The patient tolerated the procedure well. There were no complications. The patient received written and verbal post procedure care education. Post injection medications included ocuflox.      OCT, Retina - OU - Both Eyes       Right Eye Quality was good. Scan locations included subfoveal. Central Foveal Thickness: 436. Progression has been stable. Findings include abnormal foveal contour, subretinal hyper-reflective material.   Left Eye Quality was good. Scan locations included subfoveal. Central Foveal Thickness: 346. Progression has improved. Findings include abnormal foveal contour, subretinal hyper-reflective material.   Notes Large subfoveal drusenoid deposit in the macular region with a small perifoveal operculum in the vitreous but no definable PVD.  Does not appear to have vitreomacular traction  OS with subfoveal drusenoid deposit also large but also with a collection of serous subretinal fluid suggestive of wet AMD.  Overall this area of lucency has diminished over time since onset of therapy August 2022 yet still present.  Overall central thickness of 440 m is now decreased to 344 m.   May not have significant vitreal macular traction we will continue to monitor and follow, much less subretinal fluid and and shorter linear length at the base of the lesion as well as shorter height of the lesion post onset of therapy with antivegF OS   OD large subfoveal drusenoid deposit unchanged with no perifoveal lucencies or subretinal fluid or intraretinal fluid Small perifoveal operculum present but no clear definable PVD by OCT as seen on clinical exam             ASSESSMENT/PLAN:  Exudative  age-related macular degeneration of left eye with active choroidal neovascularization (HCC) OS with persistence of subretinal fluid yet stable acuity overlying which likely vascularized PED and/or drusenoid deposits subfoveal.  Repeat injection Eylea OS today to maintain visual acuity at the current 6-week interval.  Adult onset vitelliform macular dystrophy OD continue to observe  Intermediate stage nonexudative age-related macular degeneration of both eyes No sign of CNVM  ICD-10-CM   1. Exudative age-related macular degeneration of left eye with active choroidal neovascularization (HCC)  H35.3221 Intravitreal Injection, Pharmacologic Agent - OS - Left Eye    OCT, Retina - OU - Both Eyes    aflibercept (EYLEA) SOLN 2 mg    2. Adult onset vitelliform macular dystrophy  H35.54     3. Intermediate stage nonexudative age-related macular degeneration of both eyes  H35.3132       1.  OS, improved and stable macular condition overall.  Subfoveal drusenoid deposit of adult vitelliform macular dystrophy and subretinal fluid controlled and stable currently at 6-week interval of Eylea.  Repeat injection today to maintain acuity.  2.  Adult vitelliform macular dystrophy right eye, no sign of CNVM  3.  Ophthalmic Meds Ordered this visit:  Meds ordered this encounter  Medications   aflibercept (EYLEA) SOLN 2 mg       Return in about 6 weeks (around 08/09/2021) for dilate, OS, EYLEA OCT.  There are no Patient Instructions on file for this visit.   Explained the diagnoses, plan, and follow up with the patient and they expressed understanding.  Patient expressed understanding of the importance of proper follow up care.   Clent Demark Ayen Viviano M.D. Diseases & Surgery of the Retina and Vitreous [facility] 06/28/21     Abbreviations: M myopia (nearsighted); A astigmatism; H hyperopia (farsighted); P presbyopia; Mrx spectacle prescription;  CTL contact lenses; OD right  eye; OS left eye; OU both eyes  XT exotropia; ET esotropia; PEK punctate epithelial keratitis; PEE punctate epithelial erosions; DES dry eye syndrome; MGD meibomian gland dysfunction; ATs artificial tears; PFAT's preservative free artificial tears; [place] nuclear sclerotic cataract; PSC posterior subcapsular cataract; ERM epi-retinal membrane; PVD posterior vitreous detachment; RD retinal detachment; DM diabetes mellitus; DR diabetic retinopathy; NPDR non-proliferative diabetic retinopathy; PDR proliferative diabetic retinopathy; CSME clinically significant macular edema; DME diabetic macular edema; dbh dot blot hemorrhages; CWS cotton wool spot; POAG primary open angle glaucoma; C/D cup-to-disc ratio; HVF humphrey visual field; GVF goldmann visual field; OCT optical coherence tomography; IOP intraocular pressure; BRVO Branch retinal vein occlusion; CRVO central retinal vein occlusion; CRAO central retinal artery occlusion; BRAO branch retinal artery occlusion; RT retinal tear; SB scleral buckle; PPV pars plana vitrectomy; VH Vitreous hemorrhage; PRP panretinal laser photocoagulation; IVK intravitreal kenalog; VMT vitreomacular traction; MH Macular hole;  NVD neovascularization of the disc; NVE neovascularization elsewhere; AREDS age related eye disease study; ARMD age related macular degeneration; POAG primary open angle glaucoma; EBMD epithelial/anterior basement membrane dystrophy; ACIOL anterior chamber intraocular lens; IOL intraocular lens; PCIOL posterior chamber intraocular lens; Phaco/IOL phacoemulsification with intraocular lens placement; [place] photorefractive keratectomy; LASIK laser assisted in situ keratomileusis; HTN hypertension; DM diabetes mellitus; COPD chronic obstructive pulmonary disease

## 2021-06-28 NOTE — Assessment & Plan Note (Signed)
No sign of CNVM 

## 2021-06-28 NOTE — Assessment & Plan Note (Signed)
OS with persistence of subretinal fluid yet stable acuity overlying which likely vascularized PED and/or drusenoid deposits subfoveal.  Repeat injection Eylea OS today to maintain visual acuity at the current 6-week interval.

## 2021-06-28 NOTE — Assessment & Plan Note (Signed)
OD continue to observe

## 2021-07-07 IMAGING — CT CT ANGIO CHEST
2 of 6 series · 16 of 46 positions shown · IV contrast (OMNIPAQUE)
Comparison: None.

CLINICAL DATA: Foot discoloration.

EXAM:
CT ANGIOGRAPHY CHEST, ABDOMEN AND PELVIS
TECHNIQUE: Multidetector CT imaging through the chest, abdomen and pelvis was
performed using the standard protocol during bolus administration of
intravenous contrast. Multiplanar reconstructed images and MIPs were
obtained and reviewed to evaluate the vascular anatomy.
CONTRAST:  100mL OMNIPAQUE IOHEXOL 350 MG/ML SOLN

[Series 2: axial arterial upper · axial · arterial · 0.89mm/px · z∈[-712,-157]mm · 13 of 209 slices shown]
[im 12/209  lung]
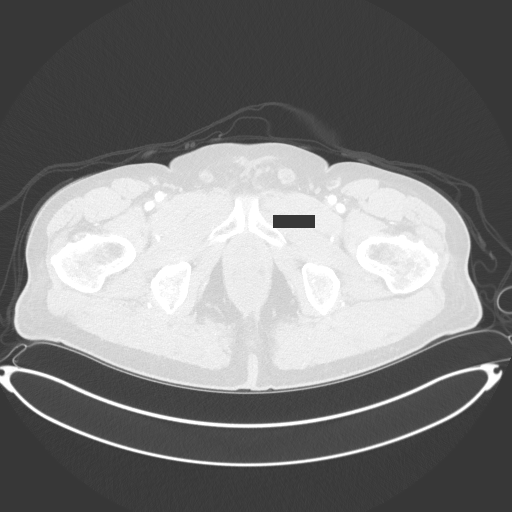
[im 24/209  soft-tissue]
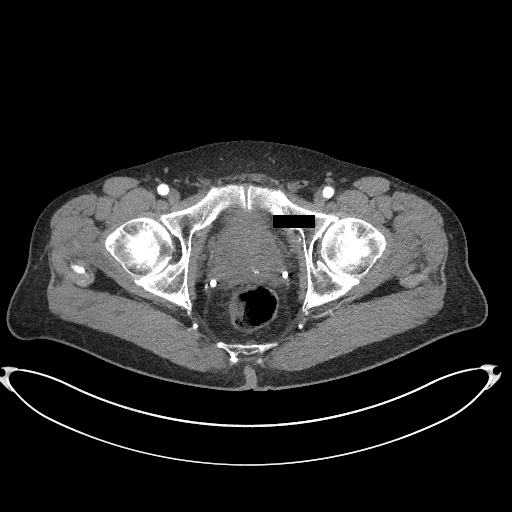
[im 47/209  lung]
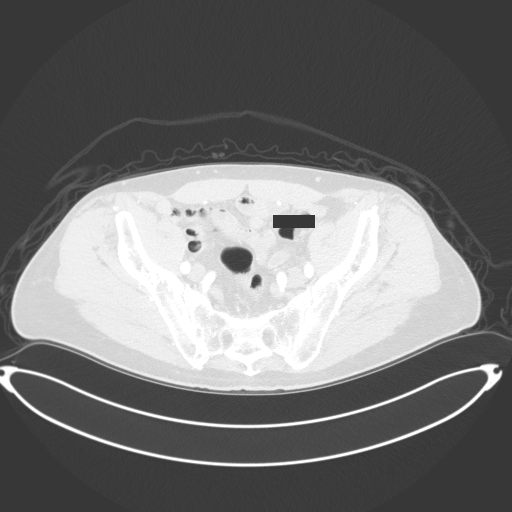
[im 58/209  soft-tissue]
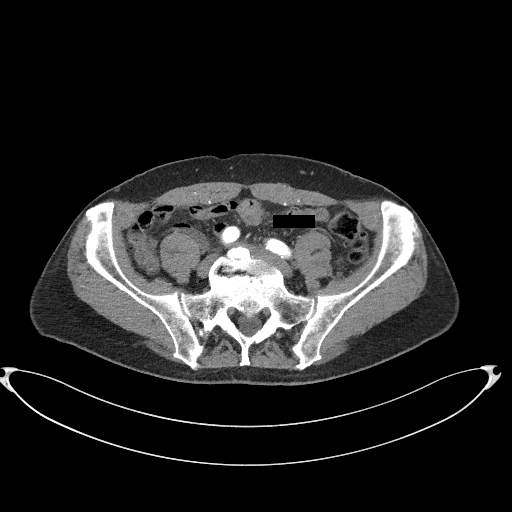
[im 70/209  lung]
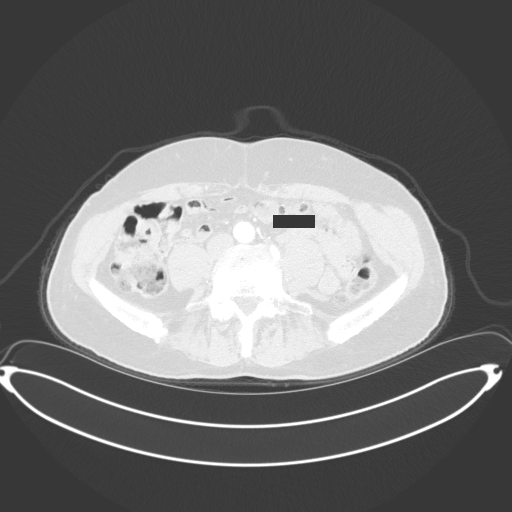
[im 93/209  soft-tissue]
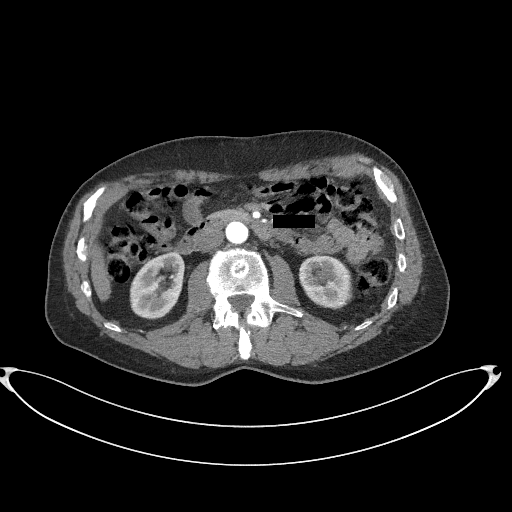
[im 105/209  lung]
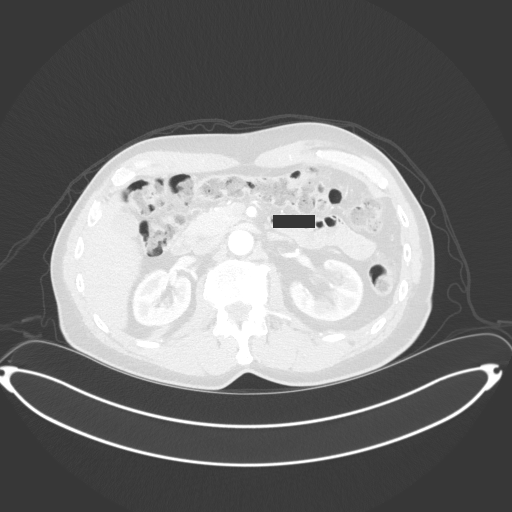
[im 116/209  soft-tissue]
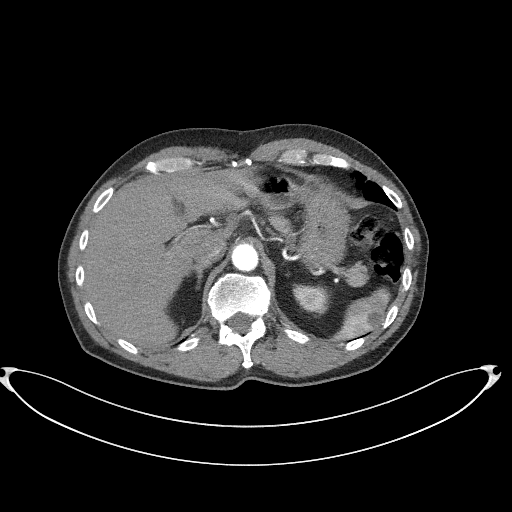
[im 139/209  lung]
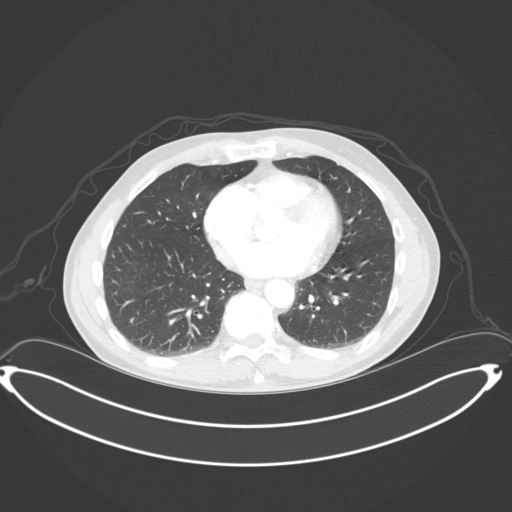
[im 151/209  soft-tissue]
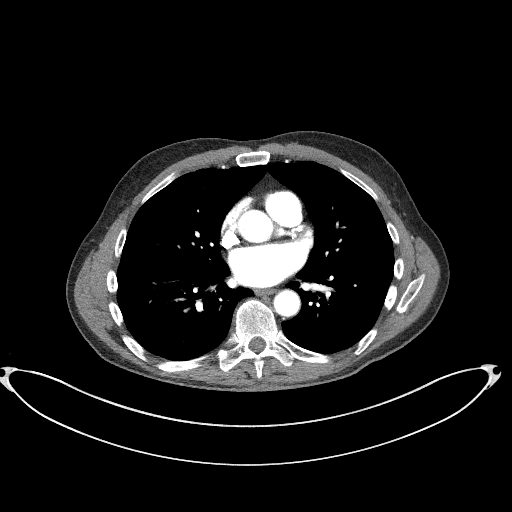
[im 162/209  lung]
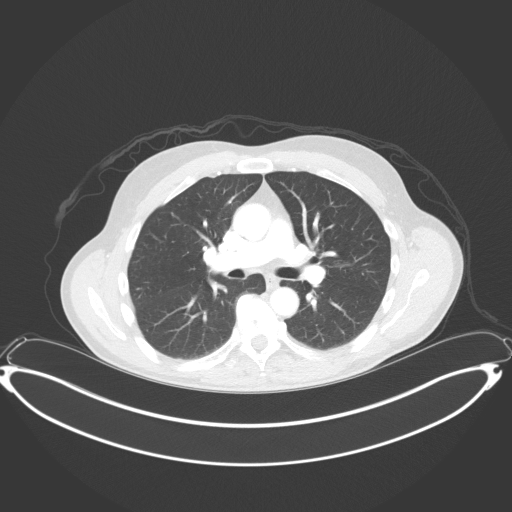
[im 185/209  soft-tissue]
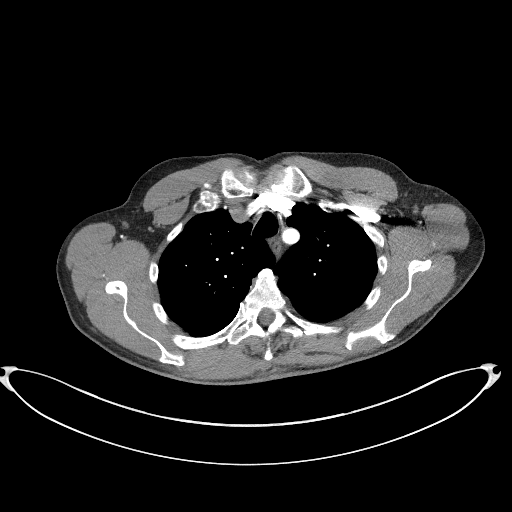
[im 197/209  lung]
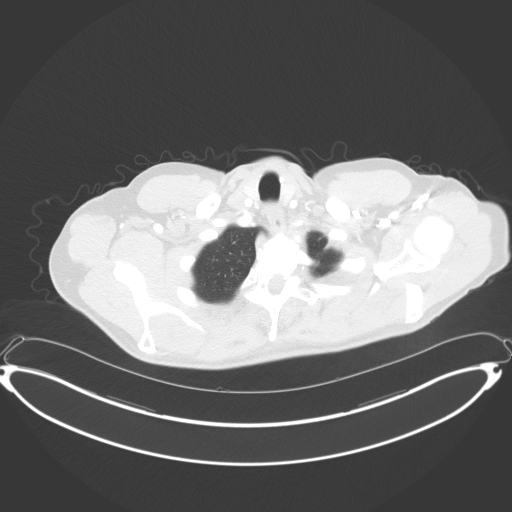

[Series 3: coronal upper · coronal · 0.79mm/px · 3 of 113 slices shown]
[im 29/113  soft-tissue]
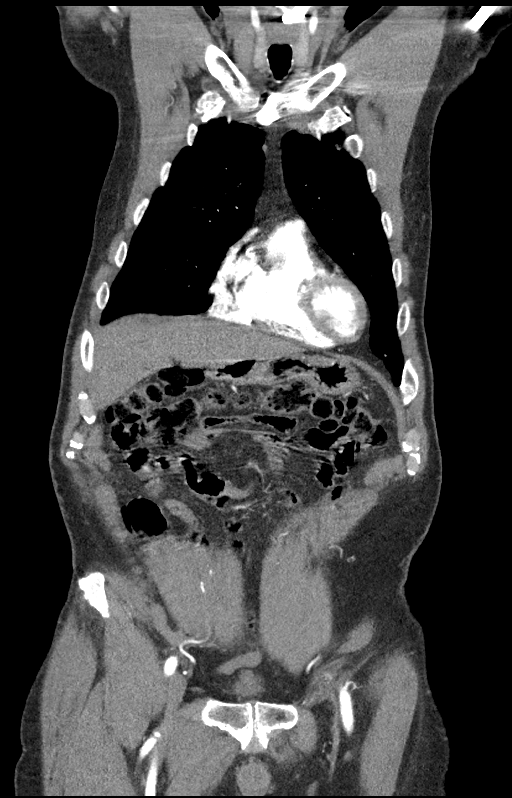
[im 57/113  soft-tissue]
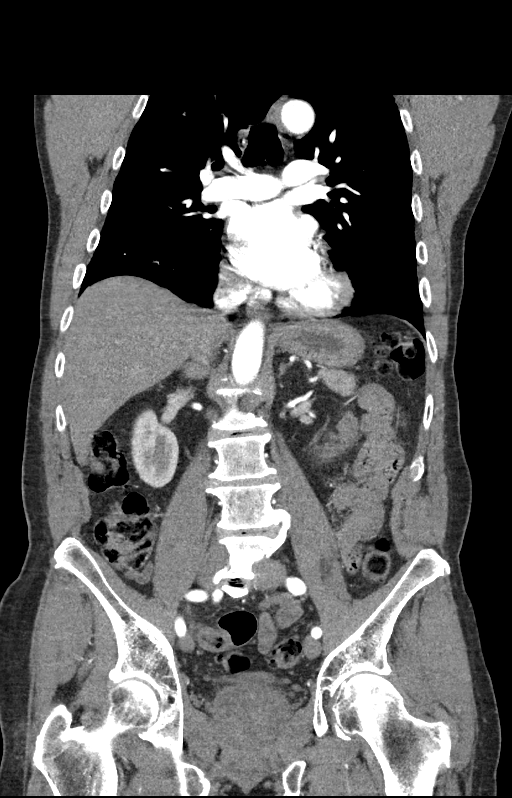
[im 85/113  soft-tissue]
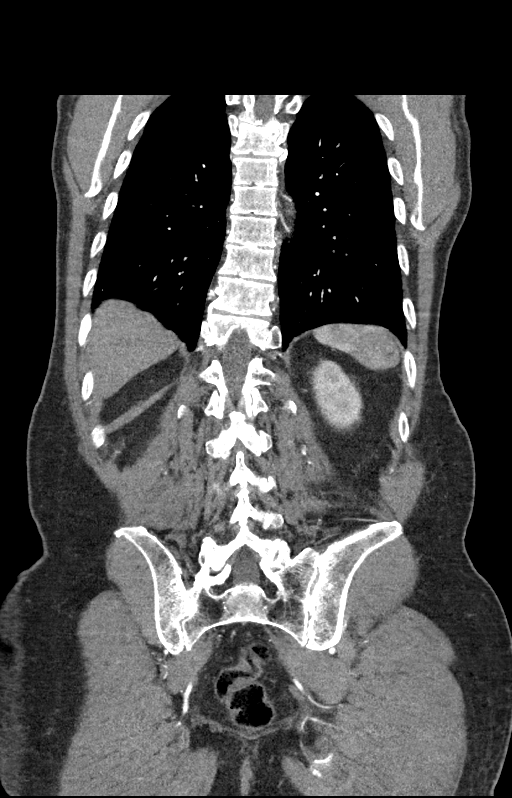

[16 of 46 positions shown; findings below may reference images not displayed]

FINDINGS: CTA CHEST FINDINGS

Cardiovascular: Satisfactory opacification of the pulmonary arteries
to the segmental level. No evidence of pulmonary embolism. Normal
heart size. No pericardial effusion.

Mediastinum/Nodes: No enlarged mediastinal, hilar, or axillary lymph
nodes. Thyroid gland, trachea, and esophagus demonstrate no
significant findings.

Lungs/Pleura: Lungs are clear. No pleural effusion or pneumothorax.

Musculoskeletal: A metallic density fusion plate and screws are seen
along the anterior aspect of the lower cervical spine.

Multilevel degenerative changes are noted throughout the thoracic
spine.

Review of the MIP images confirms the above findings.

CTA ABDOMEN AND PELVIS FINDINGS

VASCULAR

Aorta: Normal caliber aorta without aneurysm, dissection, vasculitis
or significant stenosis.

Celiac: Patent without evidence of aneurysm, dissection, vasculitis
or significant stenosis.

SMA: Patent without evidence of aneurysm, dissection, vasculitis or
significant stenosis.

Renals: Both renal arteries are patent without evidence of aneurysm,
dissection, vasculitis, fibromuscular dysplasia or significant
stenosis.

IMA: Patent without evidence of aneurysm, dissection, vasculitis or
significant stenosis.

Inflow: Patent without evidence of aneurysm, dissection, vasculitis
or significant stenosis.

Veins: No obvious venous abnormality within the limitations of this
arterial phase study.

Review of the MIP images confirms the above findings.

NON-VASCULAR

Hepatobiliary: An ill-defined, approximately 7 mm focus of
parenchymal low attenuation is seen within the posterior aspect of
the right lobe of the liver (axial CT image 83, CT series number 2).
No gallstones, gallbladder wall thickening, or biliary dilatation.

Pancreas: Unremarkable. No pancreatic ductal dilatation or
surrounding inflammatory changes.

Spleen: A 1.6 cm x 1.2 cm cystic appearing area is seen within the
spleen.

Adrenals/Urinary Tract: Adrenal glands are unremarkable. Kidneys are
normal, without renal calculi or hydronephrosis. A 2.6 cm x 1.8 cm
parapelvic renal cyst is seen on the left. Bladder is unremarkable.

Stomach/Bowel: Stomach is within normal limits. Appendix appears
normal. No evidence of bowel wall thickening, distention, or
inflammatory changes. Noninflamed diverticula are seen throughout
the sigmoid colon.

Lymphatic: No enlarged abdominal or pelvic lymph nodes.

Reproductive: The prostate gland is moderately enlarged.

Other: No abdominal wall hernia or abnormality. No abdominopelvic
ascites.

Musculoskeletal: No acute or significant osseous findings.

Review of the MIP images confirms the above findings.
IMPRESSION: 1. No evidence of pulmonary embolus.
2. No acute inflammatory process within the chest, abdomen or
pelvis.
3. Small area of low attenuation within the right lobe of the liver
which may represent a small hemangioma. Correlation with hepatic
ultrasound in follow-up abdomen pelvis CT is recommended to
determine stability.
4. Sigmoid diverticulosis.

## 2021-07-07 IMAGING — CT CT ANGIO AOBIFEM WO/W CM
2 of 5 series · 11 of 46 positions shown, 12 images · IV contrast (OMNIPAQUE)
Comparison: Chest CT-earlier same day

CLINICAL DATA: Blue toes.  Poor circulation.

EXAM:
CT ANGIOGRAPHY OF ABDOMINAL AORTA WITH ILIOFEMORAL RUNOFF
TECHNIQUE: Multidetector CT imaging of the abdomen, pelvis and lower
extremities was performed using the standard protocol during bolus
administration of intravenous contrast. Multiplanar CT image
reconstructions and MIPs were obtained to evaluate the vascular
anatomy.
CONTRAST:  100mL OMNIPAQUE IOHEXOL 350 MG/ML SOLN

[Series 5: axial arterial lower · axial · arterial · 0.98mm/px · z∈[-1660,-688]mm · 8 of 373 slices shown, 9 images]
[im 25/373  soft-tissue]
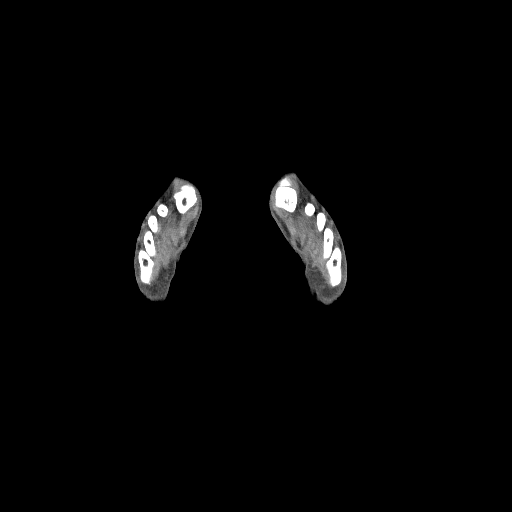
[im 25/373  bone]
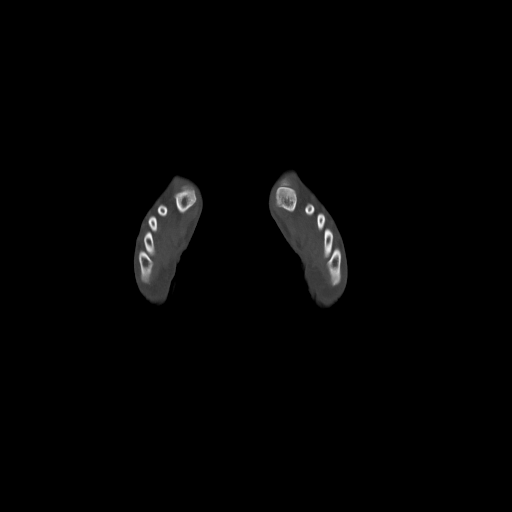
[im 73/373  soft-tissue]
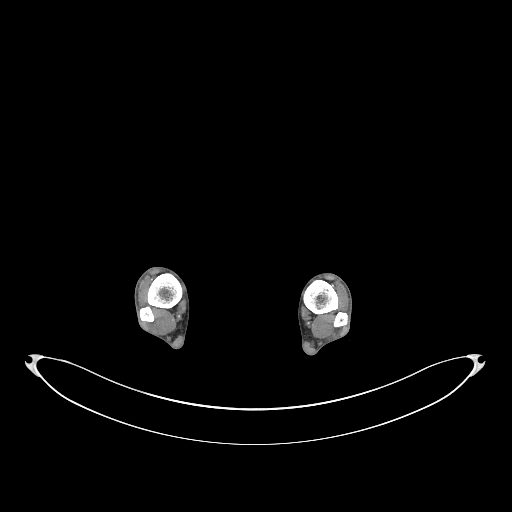
[im 121/373  soft-tissue]
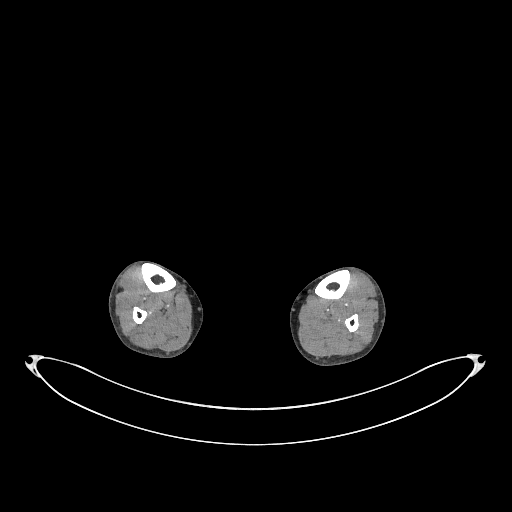
[im 169/373  soft-tissue]
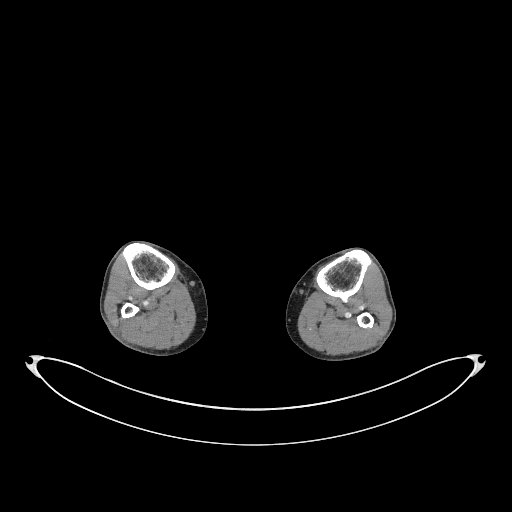
[im 205/373  soft-tissue]
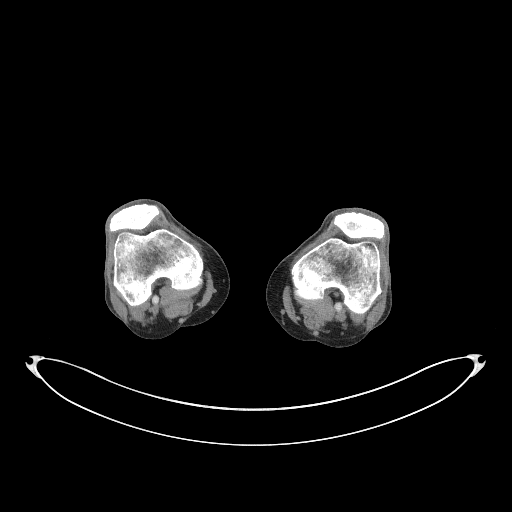
[im 253/373  soft-tissue]
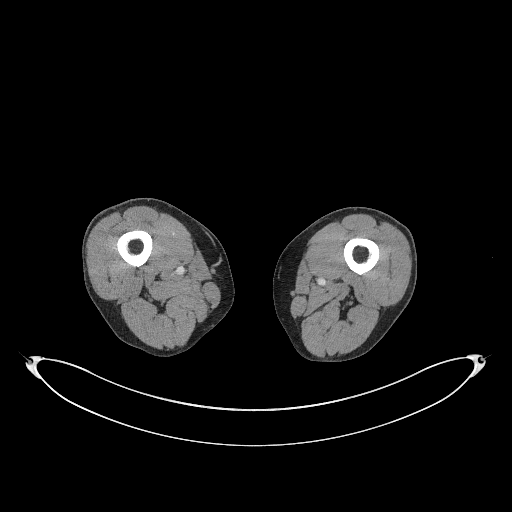
[im 301/373  soft-tissue]
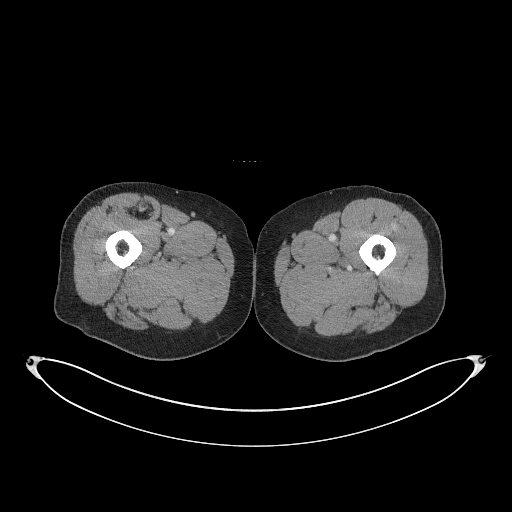
[im 349/373  soft-tissue]
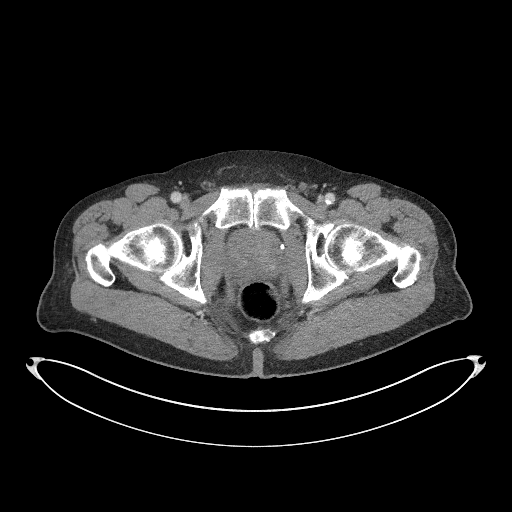

[Series 7: coronal lower · coronal · 0.78mm/px · 3 of 122 slices shown]
[im 31/122  soft-tissue]
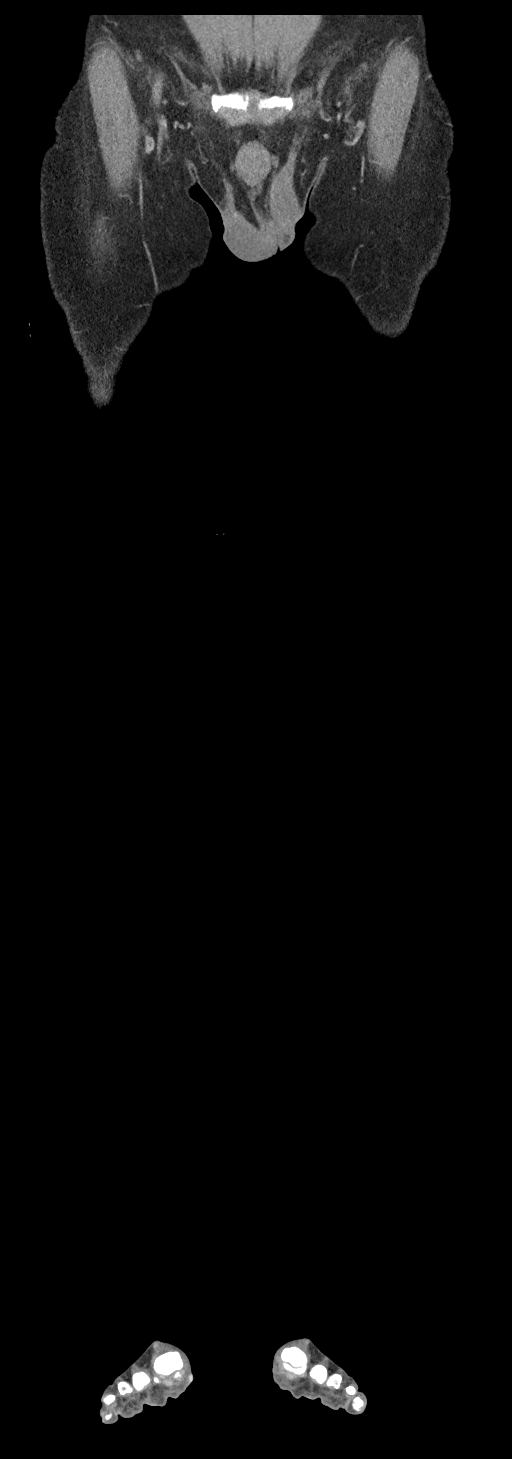
[im 61/122  soft-tissue]
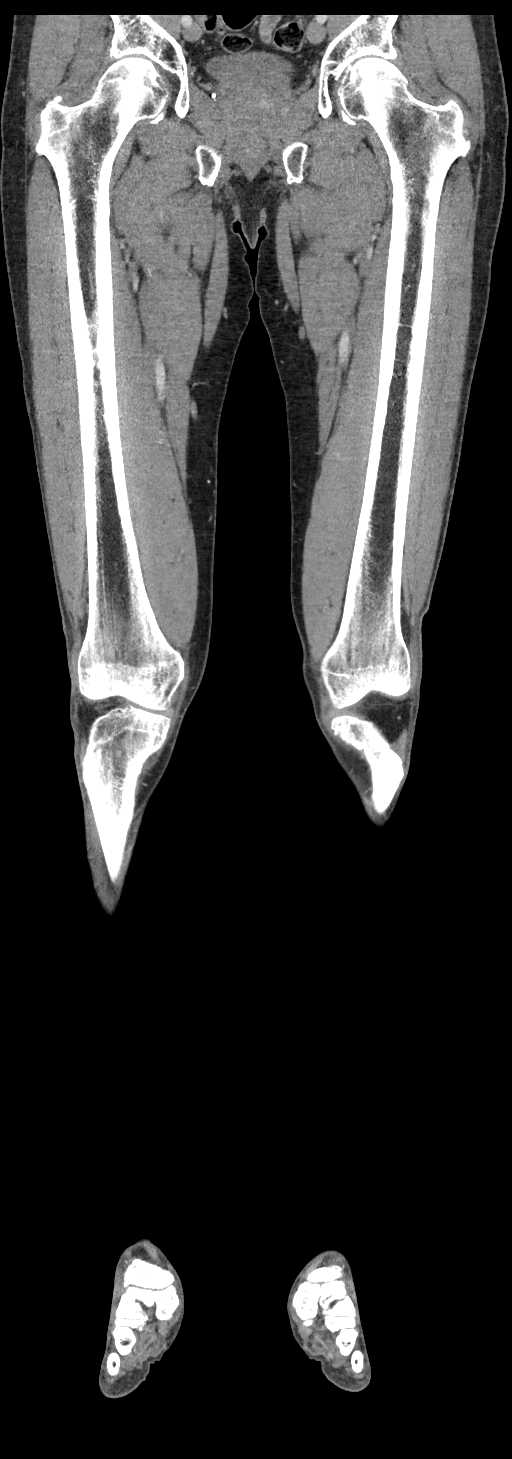
[im 91/122  soft-tissue]
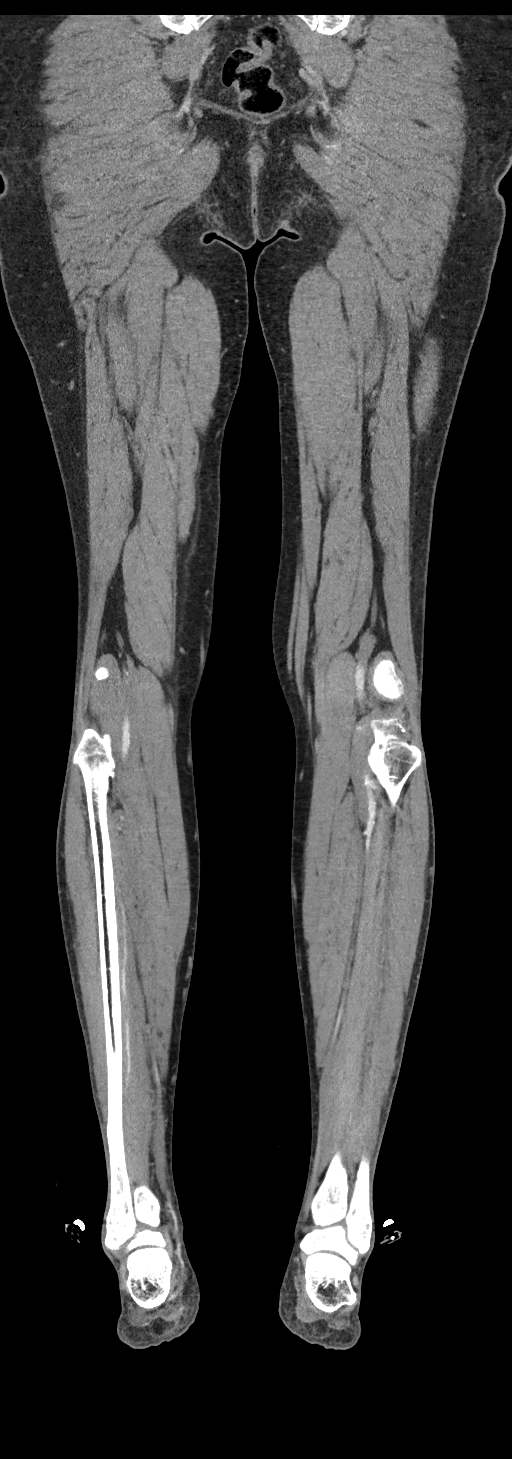

[11 of 46 positions shown; findings below may reference images not displayed]

FINDINGS: VASCULAR

RIGHT Lower Extremity

Inflow: The imaged peripheral aspect of the right external iliac
artery is of normal caliber and widely patent without
hemodynamically significant narrowing.

Outflow: The right common femoral artery is widely patent without
hemodynamically significant narrowing.

The right deep femoral artery is widely patent without a
hemodynamically significant narrowing.

There is a minimal amount of eccentric calcified plaque involving
the proximal aspect the right superficial femoral artery, not
resulting in a hemodynamically significant stenosis.

Both the right above and below-knee popliteal arteries are of normal
caliber and widely patent without a hemodynamically significant
narrowing.

Runoff: There is a minimal amount of eccentric calcified plaque
involving the tibioperoneal trunk, not resulting in a
hemodynamically significant stenosis. Three-vessel runoff to the
right lower leg. The dorsalis pedis artery is patent to the level of
the midfoot. No discrete intraluminal filling defects to suggest
distal embolism.

_________________________________________________________

LEFT Lower Extremity

Inflow: The imaged peripheral aspect of the left external iliac
artery is of normal caliber and widely patent without
hemodynamically significant narrowing.

Outflow: There is a minimal amount of eccentric calcified plaque
involving the left common femoral artery, not resulting in
hemodynamically significant stenosis

The left deep femoral artery is widely patent without
hemodynamically significant narrowing

The left superficial femoral artery is widely patent.

The left above and below-knee popliteal arteries are of normal
caliber and widely patent without hemodynamically significant
narrowing.

Runoff: Three-vessel runoff to the left lower leg and foot. The
left-sided dorsalis pedis artery is patent to the level of the
forefoot.

Veins: Proximal venous systems of the bilateral lower extremities
appear patent on this arterial phase examination.

Review of the MIP images confirms the above findings.

_________________________________________________________

_________________________________________________________

NON-VASCULAR

Limited visualization of lower thorax demonstrates an enlarged
prostate with mass effect on the undersurface of the urinary
bladder. Multiple phleboliths are seen with the lower pelvis
bilaterally.

No acute or aggressive osseous abnormalities. Mild degenerative
change the bilateral knees, left greater than right. Presumably old
fractures involving the bilateral medial malleoli.

Note is made of an intramuscular lymphoma within the right
quadriceps musculature.
IMPRESSION: 1. Minimal amount of scattered bilateral atherosclerotic plaque, not
resulting in a hemodynamically significant stenosis within either
lower extremity.
2. Three-vessel runoff to the bilateral lower legs and feet. The
left-sided dorsalis pedis artery is patent to the level of the
forefoot while the right-sided dorsalis pedis artery is patent to
the level of the midfoot. No discrete intraluminal filling defects
to suggest distal embolism.
3. Prostatomegaly with mass effect on the undersurface of the
urinary bladder. If not recently performed, further evaluation with
[place] is advised.

## 2021-08-03 ENCOUNTER — Ambulatory Visit (INDEPENDENT_AMBULATORY_CARE_PROVIDER_SITE_OTHER): Payer: Medicare HMO | Admitting: Nurse Practitioner

## 2021-08-03 ENCOUNTER — Encounter (HOSPITAL_BASED_OUTPATIENT_CLINIC_OR_DEPARTMENT_OTHER): Payer: Self-pay | Admitting: Nurse Practitioner

## 2021-08-03 VITALS — BP 117/72 | HR 67 | Ht 72.0 in | Wt 174.4 lb

## 2021-08-03 DIAGNOSIS — Z860100 Personal history of colon polyps, unspecified: Secondary | ICD-10-CM | POA: Insufficient documentation

## 2021-08-03 DIAGNOSIS — I340 Nonrheumatic mitral (valve) insufficiency: Secondary | ICD-10-CM | POA: Diagnosis not present

## 2021-08-03 DIAGNOSIS — M1990 Unspecified osteoarthritis, unspecified site: Secondary | ICD-10-CM | POA: Insufficient documentation

## 2021-08-03 DIAGNOSIS — Z961 Presence of intraocular lens: Secondary | ICD-10-CM | POA: Diagnosis not present

## 2021-08-03 DIAGNOSIS — N400 Enlarged prostate without lower urinary tract symptoms: Secondary | ICD-10-CM | POA: Insufficient documentation

## 2021-08-03 DIAGNOSIS — H353221 Exudative age-related macular degeneration, left eye, with active choroidal neovascularization: Secondary | ICD-10-CM

## 2021-08-03 DIAGNOSIS — H353132 Nonexudative age-related macular degeneration, bilateral, intermediate dry stage: Secondary | ICD-10-CM

## 2021-08-03 DIAGNOSIS — Z0000 Encounter for general adult medical examination without abnormal findings: Secondary | ICD-10-CM

## 2021-08-03 DIAGNOSIS — H524 Presbyopia: Secondary | ICD-10-CM | POA: Diagnosis not present

## 2021-08-03 NOTE — Progress Notes (Signed)
Orma Render, DNP, AGNP-c Primary Care & Sports Medicine [address]  [place], [place] [address] [phone]  New patient visit   Patient: Joseph Moses   DOB: [birth year]   76 y.o. Male  MRN: [id number] Visit Date: 08/03/2021  Patient Care Team: Sinead Hockman, Coralee Pesa, NP as PCP - General (Nurse Practitioner) Starlyn Skeans as Physician Assistant (Dermatology)  Today's Vitals   08/03/21 1418  BP: 117/72  Pulse: 67  SpO2: 92%  Weight: 174 lb 6.4 oz (79.1 kg)  Height: 6' (1.829 m)   Body mass index is 23.65 kg/m.   Today's healthcare provider: Orma Render, NP   Chief Complaint  Patient presents with   Establish Care   Subjective    Joseph Moses is a 76 y.o. male who presents today as a new patient to establish care.    Patient endorses the following concerns presently: No specific concerns today. Would like to establish and go over health history, medications, and chronic conditions.  He is a very healthy 76 y/o married man. He has no chronic medications and only takes ASA daily. He endorses a history of macular degeneration, for which he is followed by ophthalmology closely.   He is due for his annual wellness visit in November.   History reviewed and reveals the following: History reviewed. No pertinent past medical history. Past Surgical History:  Procedure Laterality Date   CERVICAL DISCECTOMY  2001 &2005   NASAL SEPTUM SURGERY  1968   No family status information on file.   History reviewed. No pertinent family history. Social History   Socioeconomic History   Marital status: Married    Spouse name: Not on file   Number of children: Not on file   Years of education: Not on file   Highest education level: Not on file  Occupational History   Not on file  Tobacco Use   Smoking status: Never   Smokeless tobacco: Never  Vaping Use   Vaping Use: Never used  Substance and Sexual Activity   Alcohol use: Yes     Alcohol/week: 1.0 standard drink of alcohol    Types: 1 Cans of beer per week   Drug use: No   Sexual activity: Not on file  Other Topics Concern   Not on file  Social History Narrative   Not on file   Social Determinants of Health   Financial Resource Strain: Not on file  Food Insecurity: Not on file  Transportation Needs: Not on file  Physical Activity: Not on file  Stress: Not on file  Social Connections: Not on file   Outpatient Medications Prior to Visit  Medication Sig   EPINEPHrine (EPIPEN 2-PAK) 0.3 mg/0.3 mL IJ SOAJ injection See admin instructions.   aspirin 81 MG chewable tablet 1 tab   [DISCONTINUED] aspirin EC 81 MG tablet Take 81 mg by mouth daily.   No facility-administered medications prior to visit.   No Known Allergies Immunization History  Administered Date(s) Administered   Influenza Split 11/17/2010, 11/28/2013, 10/23/2014, 10/21/2015, 10/07/2016, 10/25/2019   Moderna Sars-Covid-2 Vaccination 02/25/2019, 03/25/2019, 10/25/2019   Pneumococcal Conjugate-13 03/14/2013   Pneumococcal Polysaccharide-23 06/30/2010   Td 03/30/2016   Tdap 03/01/2006   Zoster, Live 06/30/2010, 01/25/2017, 04/26/2017    Review of Systems All review of systems negative except what is listed in the HPI   Objective    BP 117/72   Pulse 67   Ht 6' (1.829 m)  Wt 174 lb 6.4 oz (79.1 kg)   SpO2 92%   BMI 23.65 kg/m  Physical Exam Vitals and nursing note reviewed.  Constitutional:      Appearance: Normal appearance.  HENT:     Head: Normocephalic.  Eyes:     Extraocular Movements: Extraocular movements intact.     Conjunctiva/sclera: Conjunctivae normal.     Pupils: Pupils are equal, round, and reactive to light.  Neck:     Vascular: No carotid bruit.  Cardiovascular:     Rate and Rhythm: Normal rate and regular rhythm.     Pulses: Normal pulses.     Heart sounds: Murmur heard.  Pulmonary:     Effort: Pulmonary effort is normal.     Breath sounds: Normal breath  sounds. No wheezing.  Abdominal:     General: Abdomen is flat. Bowel sounds are normal. There is no distension.     Palpations: Abdomen is soft.     Tenderness: There is no abdominal tenderness. There is no guarding.  Musculoskeletal:        General: Normal range of motion.     Cervical back: Normal range of motion.     Right lower leg: No edema.     Left lower leg: No edema.  Lymphadenopathy:     Cervical: No cervical adenopathy.  Skin:    General: Skin is warm and dry.     Capillary Refill: Capillary refill takes less than 2 seconds.  Neurological:     General: No focal deficit present.     Mental Status: He is alert and oriented to person, place, and time.     Motor: No weakness.  Psychiatric:        Mood and Affect: Mood normal.        Behavior: Behavior normal.        Thought Content: Thought content normal.        Judgment: Judgment normal.     No results found for any visits on 08/03/21.  Assessment & Plan      Problem List Items Addressed This Visit     Exudative age-related macular degeneration of left eye with active choroidal neovascularization ([place])    Chronic condition with close management and follow-up with specialist. No alarm sx present at this time.       Relevant Medications   aspirin 81 MG chewable tablet   EPINEPHrine (EPIPEN 2-PAK) 0.3 mg/0.3 mL IJ SOAJ injection   Mitral valve regurgitation - Primary    Murmur present with no alarm symptoms at this time. Chronic in nature. Follow closely.       Relevant Medications   aspirin 81 MG chewable tablet   EPINEPHrine (EPIPEN 2-PAK) 0.3 mg/0.3 mL IJ SOAJ injection   Intermediate stage nonexudative age-related macular degeneration of both eyes   Other Visit Diagnoses     Encounter for medical examination to establish care            Return for Fall for AWV-Physical.      Tajana Crotteau, Coralee Pesa, NP, DNP, AGNP-C Primary Care & Sports Medicine at [place]

## 2021-08-03 NOTE — Patient Instructions (Signed)
Thank you for choosing [place] at [facility] for your Primary Care needs. I am excited for the opportunity to partner with you to meet your health care goals. It was a pleasure meeting you today!  Recommendations from today's visit: We can plan to do your annual wellness visit when you are due this November.  If you have any needs please don't hesitate to reach out.  Information on diet, exercise, and health maintenance recommendations are listed below. This is information to help you be sure you are on track for optimal health and monitoring.   Please look over this and let us know if you have any questions or if you have completed any of the health maintenance outside of [place] so that we can be sure your records are up to date.  ___________________________________________________________ About Me: I am an Adult-Geriatric Nurse Practitioner with a background in caring for patients for more than 20 years with a strong intensive care background. I provide primary care and sports medicine services to patients age 41 and older within this office. My education had a strong focus on caring for the older adult population, which I am passionate about. I am also the director of the APP Fellowship with [facility].   My desire is to provide you with the best service through preventive medicine and supportive care. I consider you a part of the medical team and value your input. I work diligently to ensure that you are heard and your needs are met in a safe and effective manner. I want you to feel comfortable with me as your provider and want you to know that your health concerns are important to me.  For your information, our office hours are: Monday, Tuesday, and Thursday 8:00 AM - 5:00 PM Wednesday and Friday 8:00 AM - 12:00 PM.   In my time away from the office I am teaching new APP's within the system and am unavailable, but my partner, Dr. Burnard Bunting is in the office for  emergent needs.   If you have questions or concerns, please call our office at [phone] or send [place] a MyChart message and we will respond as quickly as possible.  ____________________________________________________________ MyChart:  For all urgent or time sensitive needs we ask that you please call the office to avoid delays. Our number is [phone]. MyChart is not constantly monitored and due to the large volume of messages a day, replies may take up to 72 business hours.  MyChart Policy: MyChart allows for you to see your visit notes, after visit summary, provider recommendations, lab and tests results, make an appointment, request refills, and contact your provider or the office for non-urgent questions or concerns. Providers are seeing patients during normal business hours and do not have built in time to review MyChart messages.  We ask that you allow a minimum of 3 business days for responses to [employer]. For this reason, please do not send urgent requests through [place]. Please call the office at [phone]. New and ongoing conditions may require a visit. We have virtual and in person visit available for your convenience.  Complex MyChart concerns may require a visit. Your provider may request you schedule a virtual or in person visit to ensure we are providing the best care possible. MyChart messages sent after 11:00 AM on Friday will not be received by the provider until Monday morning.    Lab and Test Results: You will receive your lab and test results on MyChart  as soon as they are completed and results have been sent by the lab or testing facility. Due to this service, you will receive your results BEFORE your provider.  I review lab and tests results each morning prior to seeing patients. Some results require collaboration with other providers to ensure you are receiving the most appropriate care. For this reason, we ask that you please allow a minimum of 3-5  business days from the time the ALL results have been received for your provider to receive and review lab and test results and contact you about these.  Most lab and test result comments from the provider will be sent through [place]. Your provider may recommend changes to the plan of care, follow-up visits, repeat testing, ask questions, or request an office visit to discuss these results. You may reply directly to this message or call the office at [phone] to provide information for the provider or set up an appointment. In some instances, you will be called with test results and recommendations. Please let us know if this is preferred and we will make note of this in your chart to provide this for you.    If you have not heard a response to your lab or test results in 5 business days from all results returning to [place], please call the office to let us know. We ask that you please avoid calling prior to this time unless there is an emergent concern. Due to high call volumes, this can delay the resulting process.  After Hours: For all non-emergency after hours needs, please call the office at [phone] and select the option to reach the on-call provider service. On-call services are shared between multiple [place] offices and therefore it will not be possible to speak directly with your provider. On-call providers may provide medical advice and recommendations, but are unable to provide refills for maintenance medications.  For all emergency or urgent medical needs after normal business hours, we recommend that you seek care at the closest Urgent Care or Emergency Department to ensure appropriate treatment in a timely manner.  [facility] [place] has a 24 hour emergency room located on the ground floor for your convenience.   Urgent Concerns During the Business Day Providers are seeing patients from 8AM to [place] with a busy schedule and are most often not able to respond to  non-urgent calls until the end of the day or the next business day. If you should have URGENT concerns during the day, please call and speak to the nurse or schedule a same day appointment so that we can address your concern without delay.   Thank you, again, for choosing me as your health care partner. I appreciate your trust and look forward to learning more about you.   Worthy Keeler, DNP, AGNP-c ___________________________________________________________  Health Maintenance Recommendations Screening Testing Mammogram Every 1 -2 years based on history and risk factors Starting at age 42 Pap Smear Ages 21-39 every 3 years Ages 18-65 every 5 years with HPV testing More frequent testing may be required based on results and history Colon Cancer Screening Every 1-10 years based on test performed, risk factors, and history Starting at age 68 Bone Density Screening Every 2-10 years based on history Starting at age 40 for women Recommendations for men differ based on medication usage, history, and risk factors AAA Screening One time ultrasound Men 73-31 years old who have every smoked Lung Cancer Screening Low Dose Lung CT every 12 months Age 29-80  years with a 30 pack-year smoking history who still smoke or who have quit within the last 15 years  Screening Labs Routine  Labs: Complete Blood Count (CBC), Complete Metabolic Panel (CMP), Cholesterol (Lipid Panel) Every 6-12 months based on history and medications May be recommended more frequently based on current conditions or previous results Hemoglobin A1c Lab Every 3-12 months based on history and previous results Starting at age 887 or earlier with diagnosis of diabetes, high cholesterol, BMI >26, and/or risk factors Frequent monitoring for patients with diabetes to ensure blood sugar control Thyroid Panel (TSH w/ T3 & T4) Every 6 months based on history, symptoms, and risk factors May be repeated more often if on  medication HIV One time testing for all patients 63 and older May be repeated more frequently for patients with increased risk factors or exposure Hepatitis C One time testing for all patients 63 and older May be repeated more frequently for patients with increased risk factors or exposure Gonorrhea, Chlamydia Every 12 months for all sexually active persons 13-24 years Additional monitoring may be recommended for those who are considered high risk or who have symptoms PSA Men 48-67 years old with risk factors Additional screening may be recommended from age 21-69 based on risk factors, symptoms, and history  Vaccine Recommendations Tetanus Booster All adults every 10 years Flu Vaccine All patients 6 months and older every year COVID Vaccine All patients 12 years and older Initial dosing with booster May recommend additional booster based on age and health history HPV Vaccine 2 doses all patients age 88-26 Dosing may be considered for patients over 26 Shingles Vaccine (Shingrix) 2 doses all adults 66 years and older Pneumonia (Pneumovax 23) All adults 61 years and older May recommend earlier dosing based on health history Pneumonia (Prevnar 79) All adults 61 years and older Dosed 1 year after Pneumovax 23  Additional Screening, Testing, and Vaccinations may be recommended on an individualized basis based on family history, health history, risk factors, and/or exposure.  __________________________________________________________  Diet Recommendations for All Patients  I recommend that all patients maintain a diet low in saturated fats, carbohydrates, and cholesterol. While this can be challenging at first, it is not impossible and small changes can make big differences.  Things to try: Decreasing the amount of soda, sweet tea, and/or juice to one or less per day and replace with water While water is always the first choice, if you do not like water you may consider adding a  water additive without sugar to improve the taste other sugar free drinks Replace potatoes with a brightly colored vegetable at dinner Use healthy oils, such as canola oil or olive oil, instead of butter or hard margarine Limit your bread intake to two pieces or less a day Replace regular pasta with low carb pasta options Bake, broil, or grill foods instead of frying Monitor portion sizes  Eat smaller, more frequent meals throughout the day instead of large meals  An important thing to remember is, if you love foods that are not great for your health, you don't have to give them up completely. Instead, allow these foods to be a reward when you have done well. Allowing yourself to still have special treats every once in a while is a nice way to tell yourself thank you for working hard to keep yourself healthy.   Also remember that every day is a new day. If you have a bad day and "fall off the wagon", you can still  climb right back up and keep moving along on your journey!  We have resources available to help you!  Some websites that may be helpful include: www.http://carter.biz/  Www.VeryWellFit.com _____________________________________________________________  Activity Recommendations for All Patients  I recommend that all adults get at least 20 minutes of moderate physical activity that elevates your heart rate at least 5 days out of the week.  Some examples include: Walking or jogging at a pace that allows you to carry on a conversation Cycling (stationary bike or outdoors) Water aerobics Yoga Weight lifting Dancing If physical limitations prevent you from putting stress on your joints, exercise in a pool or seated in a chair are excellent options.  Do determine your MAXIMUM heart rate for activity: YOUR AGE - 220 = MAX HeartRate   Remember! Do not push yourself too hard.  Start slowly and build up your pace, speed, weight, time in exercise, etc.  Allow your body to rest between  exercise and get good sleep. You will need more water than normal when you are exerting yourself. Do not wait until you are thirsty to drink. Drink with a purpose of getting in at least 8, 8 ounce glasses of water a day plus more depending on how much you exercise and sweat.    If you begin to develop dizziness, chest pain, abdominal pain, jaw pain, shortness of breath, headache, vision changes, lightheadedness, or other concerning symptoms, stop the activity and allow your body to rest. If your symptoms are severe, seek emergency evaluation immediately. If your symptoms are concerning, but not severe, please let us know so that we can recommend further evaluation.

## 2021-08-09 ENCOUNTER — Encounter (INDEPENDENT_AMBULATORY_CARE_PROVIDER_SITE_OTHER): Payer: Medicare HMO | Admitting: Ophthalmology

## 2021-08-09 ENCOUNTER — Encounter (INDEPENDENT_AMBULATORY_CARE_PROVIDER_SITE_OTHER): Payer: Self-pay | Admitting: Ophthalmology

## 2021-08-09 ENCOUNTER — Ambulatory Visit (INDEPENDENT_AMBULATORY_CARE_PROVIDER_SITE_OTHER): Payer: Medicare HMO | Admitting: Ophthalmology

## 2021-08-09 DIAGNOSIS — H3554 Dystrophies primarily involving the retinal pigment epithelium: Secondary | ICD-10-CM

## 2021-08-09 DIAGNOSIS — H353221 Exudative age-related macular degeneration, left eye, with active choroidal neovascularization: Secondary | ICD-10-CM

## 2021-08-09 MED ORDER — AFLIBERCEPT 2MG/0.05ML IZ SOLN FOR KALEIDOSCOPE
2.0000 mg | INTRAVITREAL | Status: AC | PRN
  Administered 2021-08-09: 2 mg via INTRAVITREAL

## 2021-08-09 NOTE — Assessment & Plan Note (Signed)
Stable OU 

## 2021-08-09 NOTE — Progress Notes (Signed)
08/09/2021     CHIEF COMPLAINT Patient presents for No chief complaint on file.     HISTORY OF PRESENT ILLNESS: Joseph Moses is a 76 y.o. male who presents to the clinic today for:   HPI   6 week dilate, OS, Eylea OCT Pt states his vision has been stable Pt denies any new floaters or FOL  Last edited by Morene Rankins, CMA on 08/09/2021  9:00 AM.      Referring physician: Hulan Fess, MD [place],  [place] [address]  HISTORICAL INFORMATION:   Selected notes from the [facility]: No current outpatient medications on file. (Ophthalmic Drugs)   No current facility-administered medications for this visit. (Ophthalmic Drugs)   Current Outpatient Medications (Other)  Medication Sig   aspirin 81 MG chewable tablet 1 tab   EPINEPHrine (EPIPEN 2-PAK) 0.3 mg/0.3 mL IJ SOAJ injection See admin instructions.   No current facility-administered medications for this visit. (Other)      REVIEW OF SYSTEMS: ROS   Negative for: Constitutional, Gastrointestinal, Neurological, Skin, Genitourinary, Musculoskeletal, HENT, Endocrine, Cardiovascular, Eyes, Respiratory, Psychiatric, Allergic/Imm, Heme/Lymph Last edited by Orene Desanctis D, CMA on 08/09/2021  9:00 AM.       ALLERGIES No Known Allergies  PAST MEDICAL HISTORY History reviewed. No pertinent past medical history. Past Surgical History:  Procedure Laterality Date   CERVICAL DISCECTOMY  2001 &2005   NASAL SEPTUM SURGERY  1968    FAMILY HISTORY History reviewed. No pertinent family history.  SOCIAL HISTORY Social History   Tobacco Use   Smoking status: Never   Smokeless tobacco: Never  Vaping Use   Vaping Use: Never used  Substance Use Topics   Alcohol use: Yes    Alcohol/week: 1.0 standard drink of alcohol    Types: 1 Cans of beer per week   Drug use: No         OPHTHALMIC EXAM:  Base Eye Exam     Visual Acuity (ETDRS)       Right  Left   Dist [place] 20/30 20/25         Tonometry (Tonopen, 9:09 AM)       Right Left   Pressure 13 15         Pupils       Pupils APD   Right PERRL None   Left PERRL None         Visual Fields       Left Right    Full Full         Extraocular Movement       Right Left    Full, Ortho Full, Ortho         Neuro/Psych     Oriented x3: Yes   Mood/Affect: Normal         Dilation     Left eye: 2.5% Phenylephrine, 1.0% Mydriacyl @ 9:02 AM           Slit Lamp and Fundus Exam     External Exam       Right Left   External Normal Normal         Slit Lamp Exam       Right Left   Lids/Lashes Normal Normal   Conjunctiva/Sclera White and quiet White and quiet   Cornea Clear Clear   Anterior Chamber Deep and quiet Deep and quiet   Iris Round and reactive Round and reactive  Lens Posterior chamber intraocular lens, trace PCO Posterior chamber intraocular lens, 2+ Posterior capsular opacification   Anterior Vitreous Normal Normal         Fundus Exam       Right Left   Posterior Vitreous  Posterior vitreous detachment   Disc  Normal   C/D Ratio  0.45   Macula  Intermediate age related macular degeneration, large subfoveal drusenoid deposit, vitelliform like   Vessels  Normal   Periphery  Normal, along inferotemporal arcade, incidental myelinated nerve fibers no change            IMAGING AND PROCEDURES  Imaging and Procedures for 08/09/21  OCT, Retina - OU - Both Eyes       Right Eye Quality was good. Scan locations included subfoveal. Central Foveal Thickness: 437. Progression has been stable. Findings include abnormal foveal contour, subretinal hyper-reflective material.   Left Eye Quality was good. Scan locations included subfoveal. Central Foveal Thickness: 352. Progression has improved. Findings include abnormal foveal contour, subretinal hyper-reflective material.   Notes Large subfoveal drusenoid deposit in the macular region  with a small perifoveal operculum in the vitreous but no definable PVD.  Does not appear to have vitreomacular traction  OS with subfoveal drusenoid deposit also large but also with a collection of serous subretinal fluid suggestive of wet AMD.  Overall this area of lucency has diminished over time since onset of therapy August 2022 yet still present.  Overall central thickness of 440 m is now decreased to 344 m.   May not have significant vitreal macular traction we will continue to monitor and follow, much less subretinal fluid and and shorter linear length at the base of the lesion as well as shorter height of the lesion post onset of therapy with antivegF OS   OD large subfoveal drusenoid deposit unchanged with no perifoveal lucencies or subretinal fluid or intraretinal fluid Small perifoveal operculum present but no clear definable PVD by OCT as seen on clinical exam     Intravitreal Injection, Pharmacologic Agent - OS - Left Eye       Time Out 08/09/2021. 9:48 AM. Confirmed correct patient, procedure, site, and patient consented.   Anesthesia Topical anesthesia was used. Anesthetic medications included Lidocaine 4%.   Procedure Preparation included 5% betadine to ocular surface, 10% betadine to eyelids, Tobramycin 0.3%. A 30 gauge needle was used.   Injection: 2 mg aflibercept 2 MG/0.05ML   Route: Intravitreal, Site: Left Eye   NDC: [id number], Lot: [id number], Expiration date: 04/25/2022, Waste: 0 mL   Post-op Post injection exam found visual acuity of at least counting fingers. The patient tolerated the procedure well. There were no complications. The patient received written and verbal post procedure care education. Post injection medications included ocuflox.              ASSESSMENT/PLAN:  Adult onset vitelliform macular dystrophy Stable OU.  Exudative age-related macular degeneration of left eye with active choroidal neovascularization (HCC) Less subretinal  fluid over the dome of vitelliform macular deposits.  No intraretinal fluid remains.  At 6-week interval today.  Repeat Eylea injection     ICD-10-CM   1. Exudative age-related macular degeneration of left eye with active choroidal neovascularization (HCC)  H35.3221 OCT, Retina - OU - Both Eyes    Intravitreal Injection, Pharmacologic Agent - OS - Left Eye    aflibercept (EYLEA) SOLN 2 mg    2. Adult onset vitelliform macular dystrophy  H35.54  1.  OS, stable acuity. Intraretinal fluid over the dome of the large subfoveal drusenoid deposit is remained stable.  No intraretinal decrease subfoveal fluid.  2.  3.  Ophthalmic Meds Ordered this visit:  Meds ordered this encounter  Medications   aflibercept (EYLEA) SOLN 2 mg       Return in about 6 weeks (around 09/20/2021) for dilate, EYLEA OCT.  There are no Patient Instructions on file for this visit.   Explained the diagnoses, plan, and follow up with the patient and they expressed understanding.  Patient expressed understanding of the importance of proper follow up care.   Clent Demark Cruze Zingaro M.D. Diseases & Surgery of the Retina and Vitreous [facility]ina [facility]abetic [place] 08/09/21     Abbreviations: M myopia (nearsighted); A astigmatism; H hyperopia (farsighted); P presbyopia; Mrx spectacle prescription;  CTL contact lenses; OD right eye; OS left eye; OU both eyes  XT exotropia; ET esotropia; PEK punctate epithelial keratitis; PEE punctate epithelial erosions; DES dry eye syndrome; MGD meibomian gland dysfunction; ATs artificial tears; PFAT's preservative free artificial tears; [place] nuclear sclerotic cataract; PSC posterior subcapsular cataract; ERM epi-retinal membrane; PVD posterior vitreous detachment; RD retinal detachment; DM diabetes mellitus; DR diabetic retinopathy; NPDR non-proliferative diabetic retinopathy; PDR proliferative diabetic retinopathy; CSME clinically significant macular edema; DME diabetic macular edema;  dbh dot blot hemorrhages; CWS cotton wool spot; POAG primary open angle glaucoma; C/D cup-to-disc ratio; HVF humphrey visual field; GVF goldmann visual field; OCT optical coherence tomography; IOP intraocular pressure; BRVO Branch retinal vein occlusion; CRVO central retinal vein occlusion; CRAO central retinal artery occlusion; BRAO branch retinal artery occlusion; RT retinal tear; SB scleral buckle; PPV pars plana vitrectomy; VH Vitreous hemorrhage; PRP panretinal laser photocoagulation; IVK intravitreal kenalog; VMT vitreomacular traction; MH Macular hole;  NVD neovascularization of the disc; NVE neovascularization elsewhere; AREDS age related eye disease study; ARMD age related macular degeneration; POAG primary open angle glaucoma; EBMD epithelial/anterior basement membrane dystrophy; ACIOL anterior chamber intraocular lens; IOL intraocular lens; PCIOL posterior chamber intraocular lens; Phaco/IOL phacoemulsification with intraocular lens placement; [place] photorefractive keratectomy; LASIK laser assisted in situ keratomileusis; HTN hypertension; DM diabetes mellitus; COPD chronic obstructive pulmonary disease

## 2021-08-09 NOTE — Assessment & Plan Note (Addendum)
Less subretinal fluid over the dome of vitelliform macular deposits.  No intraretinal fluid remains.  At 6-week interval today.  Repeat Eylea injection

## 2021-08-17 DIAGNOSIS — Z0001 Encounter for general adult medical examination with abnormal findings: Secondary | ICD-10-CM | POA: Diagnosis not present

## 2021-08-17 DIAGNOSIS — Z0100 Encounter for examination of eyes and vision without abnormal findings: Secondary | ICD-10-CM | POA: Diagnosis not present

## 2021-09-10 DIAGNOSIS — M1990 Unspecified osteoarthritis, unspecified site: Secondary | ICD-10-CM | POA: Diagnosis not present

## 2021-09-10 DIAGNOSIS — E785 Hyperlipidemia, unspecified: Secondary | ICD-10-CM | POA: Diagnosis not present

## 2021-09-10 DIAGNOSIS — Z823 Family history of stroke: Secondary | ICD-10-CM | POA: Diagnosis not present

## 2021-09-10 DIAGNOSIS — R69 Illness, unspecified: Secondary | ICD-10-CM | POA: Diagnosis not present

## 2021-09-10 DIAGNOSIS — Z8261 Family history of arthritis: Secondary | ICD-10-CM | POA: Diagnosis not present

## 2021-09-10 DIAGNOSIS — Z008 Encounter for other general examination: Secondary | ICD-10-CM | POA: Diagnosis not present

## 2021-09-10 DIAGNOSIS — R32 Unspecified urinary incontinence: Secondary | ICD-10-CM | POA: Diagnosis not present

## 2021-09-10 DIAGNOSIS — Z91038 Other insect allergy status: Secondary | ICD-10-CM | POA: Diagnosis not present

## 2021-09-10 DIAGNOSIS — I70209 Unspecified atherosclerosis of native arteries of extremities, unspecified extremity: Secondary | ICD-10-CM | POA: Diagnosis not present

## 2021-09-10 DIAGNOSIS — G4733 Obstructive sleep apnea (adult) (pediatric): Secondary | ICD-10-CM | POA: Diagnosis not present

## 2021-09-10 DIAGNOSIS — Z791 Long term (current) use of non-steroidal anti-inflammatories (NSAID): Secondary | ICD-10-CM | POA: Diagnosis not present

## 2021-09-10 DIAGNOSIS — Z809 Family history of malignant neoplasm, unspecified: Secondary | ICD-10-CM | POA: Diagnosis not present

## 2021-09-12 NOTE — Assessment & Plan Note (Signed)
Chronic condition with close management and follow-up with specialist. No alarm sx present at this time.

## 2021-09-12 NOTE — Assessment & Plan Note (Signed)
Murmur present with no alarm symptoms at this time. Chronic in nature. Follow closely.

## 2021-09-21 ENCOUNTER — Encounter (INDEPENDENT_AMBULATORY_CARE_PROVIDER_SITE_OTHER): Payer: Medicare HMO | Admitting: Ophthalmology

## 2021-09-21 ENCOUNTER — Encounter (INDEPENDENT_AMBULATORY_CARE_PROVIDER_SITE_OTHER): Payer: Self-pay | Admitting: Ophthalmology

## 2021-09-21 ENCOUNTER — Ambulatory Visit (INDEPENDENT_AMBULATORY_CARE_PROVIDER_SITE_OTHER): Payer: Medicare HMO | Admitting: Ophthalmology

## 2021-09-21 DIAGNOSIS — H353132 Nonexudative age-related macular degeneration, bilateral, intermediate dry stage: Secondary | ICD-10-CM | POA: Diagnosis not present

## 2021-09-21 DIAGNOSIS — H353221 Exudative age-related macular degeneration, left eye, with active choroidal neovascularization: Secondary | ICD-10-CM | POA: Diagnosis not present

## 2021-09-21 MED ORDER — AFLIBERCEPT 2MG/0.05ML IZ SOLN FOR KALEIDOSCOPE
2.0000 mg | INTRAVITREAL | Status: AC | PRN
  Administered 2021-09-21: 2 mg via INTRAVITREAL

## 2021-09-21 NOTE — Progress Notes (Signed)
09/21/2021     CHIEF COMPLAINT Patient presents for  Chief Complaint  Patient presents with   Macular Degeneration      HISTORY OF PRESENT ILLNESS: Joseph Moses is a 76 y.o. male who presents to the clinic today for:   HPI   6 week dilate OS eylea oct Pt states his vision has been very stable Pt denies any new floaters or FOL  Last edited by Morene Rankins, CMA on 09/21/2021  8:03 AM.      Referring physician: Orma Render, NP [address] [place],  [place] [address]  HISTORICAL INFORMATION:   Selected notes from the [facility]: No current outpatient medications on file. (Ophthalmic Drugs)   No current facility-administered medications for this visit. (Ophthalmic Drugs)   Current Outpatient Medications (Other)  Medication Sig   aspirin 81 MG chewable tablet 1 tab   EPINEPHrine (EPIPEN 2-PAK) 0.3 mg/0.3 mL IJ SOAJ injection See admin instructions.   No current facility-administered medications for this visit. (Other)      REVIEW OF SYSTEMS: ROS   Negative for: Constitutional, Gastrointestinal, Neurological, Skin, Genitourinary, Musculoskeletal, HENT, Endocrine, Cardiovascular, Eyes, Respiratory, Psychiatric, Allergic/Imm, Heme/Lymph Last edited by Orene Desanctis D, CMA on 09/21/2021  8:03 AM.       ALLERGIES No Known Allergies  PAST MEDICAL HISTORY History reviewed. No pertinent past medical history. Past Surgical History:  Procedure Laterality Date   CERVICAL DISCECTOMY  2001 &2005   NASAL SEPTUM SURGERY  1968    FAMILY HISTORY History reviewed. No pertinent family history.  SOCIAL HISTORY Social History   Tobacco Use   Smoking status: Never   Smokeless tobacco: Never  Vaping Use   Vaping Use: Never used  Substance Use Topics   Alcohol use: Yes    Alcohol/week: 1.0 standard drink of alcohol    Types: 1 Cans of beer per week   Drug use: No         OPHTHALMIC EXAM:  Base  Eye Exam     Visual Acuity (ETDRS)       Right Left   Dist cc 20/25 -1 20/30    Correction: Glasses         Tonometry (Tonopen, 8:09 AM)       Right Left   Pressure 10 18         Pupils       Pupils   Right PERRL   Left PERRL         Visual Fields       Left Right    Full Full         Extraocular Movement       Right Left    Ortho Ortho    -- -- --  --  --  -- -- --   -- -- --  --  --  -- -- --           Neuro/Psych     Oriented x3: Yes   Mood/Affect: Normal         Dilation     Left eye: 2.5% Phenylephrine, 1.0% Mydriacyl @ 8:05 AM           Slit Lamp and Fundus Exam     External Exam       Right Left   External Normal Normal         Slit Lamp Exam       Right  Left   Lids/Lashes Normal Normal   Conjunctiva/Sclera White and quiet White and quiet   Cornea Clear Clear   Anterior Chamber Deep and quiet Deep and quiet   Iris Round and reactive Round and reactive   Lens Posterior chamber intraocular lens, trace PCO Posterior chamber intraocular lens, 2+ Posterior capsular opacification   Anterior Vitreous Normal Normal         Fundus Exam       Right Left   Posterior Vitreous  Posterior vitreous detachment   Disc  Normal   C/D Ratio  0.45   Macula  Intermediate age related macular degeneration, large subfoveal drusenoid deposit, vitelliform like   Vessels  Normal   Periphery  Normal, along inferotemporal arcade, incidental myelinated nerve fibers no change            IMAGING AND PROCEDURES  Imaging and Procedures for 09/21/21  OCT, Retina - OU - Both Eyes       Right Eye Quality was good. Scan locations included subfoveal. Central Foveal Thickness: 437.   Left Eye Quality was good. Scan locations included subfoveal. Central Foveal Thickness: 340.   Notes Large drusenoid deposit OD- 290um vertical, and 1136um horizontal  OS, large subfoveal drusenoid deposit smaller as compared to February 17, 2021.   Horizontal dimension is 915 m down over 100 m from January and vertical height is 147 m down from 150 to     Intravitreal Injection, Pharmacologic Agent - OS - Left Eye       Time Out 09/21/2021. 9:14 AM. Confirmed correct patient, procedure, site, and patient consented.   Anesthesia Topical anesthesia was used. Anesthetic medications included Lidocaine 4%.   Procedure Preparation included 5% betadine to ocular surface, 10% betadine to eyelids, Tobramycin 0.3%. A 30 gauge needle was used.   Injection: 2 mg aflibercept 2 MG/0.05ML   Route: Intravitreal, Site: Left Eye   NDC: [id number], Lot: [id number], Expiration date: 04/25/2022, Waste: 0 mL   Post-op Post injection exam found visual acuity of at least counting fingers. The patient tolerated the procedure well. There were no complications. The patient received written and verbal post procedure care education. Post injection medications included ocuflox.              ASSESSMENT/PLAN:  Exudative age-related macular degeneration of left eye with active choroidal neovascularization (HCC) OS stable acuity improving macular anatomy.  Smaller size of the large subfoveal vitelliform type subfoveal drusenoid deposit.  On therapy currently at 1 month.  Repeat Eylea today we will extend interval examination next 8 weeks  Intermediate stage nonexudative age-related macular degeneration of both eyes Similar large vitelliform subfoveal deposit right eye which has increased in vertical height as well as in brightness of dementia and since February 17, 2021 will continue to monitor as this condition has not been affiliated or associated with subretinal fluid as has the left eye in the past     ICD-10-CM   1. Exudative age-related macular degeneration of left eye with active choroidal neovascularization (HCC)  H35.3221 OCT, Retina - OU - Both Eyes    Intravitreal Injection, Pharmacologic Agent - OS - Left Eye    aflibercept (EYLEA) SOLN 2  mg    2. Intermediate stage nonexudative age-related macular degeneration of both eyes  H35.3132       1.  OS doing well with smaller Soft foveal drusenoid deposit size over time because compared to February 17, 2021 on intravitreal antivegF since that time  2.  OD enlarging  drusenoid deposit but no sign of CNVM good acuity will continue observe  3.  Ophthalmic Meds Ordered this visit:  Meds ordered this encounter  Medications   aflibercept (EYLEA) SOLN 2 mg       Return in about 8 weeks (around 11/16/2021) for DILATE OU, EYLEA OCT, OS.  There are no Patient Instructions on file for this visit.   Explained the diagnoses, plan, and follow up with the patient and they expressed understanding.  Patient expressed understanding of the importance of proper follow up care.   Clent Demark Janari Gagner M.D. Diseases & Surgery of the Retina and Vitreous Retina [facility] Diabetic [place] 09/21/21     Abbreviations: M myopia (nearsighted); A astigmatism; H hyperopia (farsighted); P presbyopia; Mrx spectacle prescription;  CTL contact lenses; OD right eye; OS left eye; OU both eyes  XT exotropia; ET esotropia; PEK punctate epithelial keratitis; PEE punctate epithelial erosions; DES dry eye syndrome; MGD meibomian gland dysfunction; ATs artificial tears; PFAT's preservative free artificial tears; [place] nuclear sclerotic cataract; PSC posterior subcapsular cataract; ERM epi-retinal membrane; PVD posterior vitreous detachment; RD retinal detachment; DM diabetes mellitus; DR diabetic retinopathy; NPDR non-proliferative diabetic retinopathy; PDR proliferative diabetic retinopathy; CSME clinically significant macular edema; DME diabetic macular edema; dbh dot blot hemorrhages; CWS cotton wool spot; POAG primary open angle glaucoma; C/D cup-to-disc ratio; HVF humphrey visual field; GVF goldmann visual field; OCT optical coherence tomography; IOP intraocular pressure; BRVO Branch retinal vein occlusion; CRVO central  retinal vein occlusion; CRAO central retinal artery occlusion; BRAO branch retinal artery occlusion; RT retinal tear; SB scleral buckle; PPV pars plana vitrectomy; VH Vitreous hemorrhage; PRP panretinal laser photocoagulation; IVK intravitreal kenalog; VMT vitreomacular traction; MH Macular hole;  NVD neovascularization of the disc; NVE neovascularization elsewhere; AREDS age related eye disease study; ARMD age related macular degeneration; POAG primary open angle glaucoma; EBMD epithelial/anterior basement membrane dystrophy; ACIOL anterior chamber intraocular lens; IOL intraocular lens; PCIOL posterior chamber intraocular lens; Phaco/IOL phacoemulsification with intraocular lens placement; [place] photorefractive keratectomy; LASIK laser assisted in situ keratomileusis; HTN hypertension; DM diabetes mellitus; COPD chronic obstructive pulmonary disease

## 2021-09-21 NOTE — Assessment & Plan Note (Signed)
OS stable acuity improving macular anatomy.  Smaller size of the large subfoveal vitelliform type subfoveal drusenoid deposit.  On therapy currently at 1 month.  Repeat Eylea today we will extend interval examination next 8 weeks

## 2021-09-21 NOTE — Assessment & Plan Note (Signed)
Similar large vitelliform subfoveal deposit right eye which has increased in vertical height as well as in brightness of dementia and since February 17, 2021 will continue to monitor as this condition has not been affiliated or associated with subretinal fluid as has the left eye in the past

## 2021-10-01 ENCOUNTER — Ambulatory Visit (INDEPENDENT_AMBULATORY_CARE_PROVIDER_SITE_OTHER): Payer: Medicare HMO

## 2021-10-01 ENCOUNTER — Encounter (HOSPITAL_BASED_OUTPATIENT_CLINIC_OR_DEPARTMENT_OTHER): Payer: Self-pay

## 2021-10-01 ENCOUNTER — Encounter (HOSPITAL_BASED_OUTPATIENT_CLINIC_OR_DEPARTMENT_OTHER): Payer: Medicare HMO

## 2021-10-01 DIAGNOSIS — Z0000 Encounter for general adult medical examination without abnormal findings: Secondary | ICD-10-CM | POA: Diagnosis not present

## 2021-10-01 NOTE — Progress Notes (Signed)
Subjective:   KLARK VANDERHOEF is a 76 y.o. male who presents for an Initial Medicare Annual Wellness Visit. I connected with  Miko Sirico Fayad on 10/01/21 by a video and audio enabled telemedicine application and verified that I am speaking with the correct person using two identifiers.  Patient Location: Home  Provider Location: Home Office  I discussed the limitations of evaluation and management by telemedicine. The patient expressed understanding and agreed to proceed.     Objective:    Today's Vitals   10/01/21 1156  PainSc: 0-No pain   There is no height or weight on file to calculate BMI.     03/01/2019    8:47 AM 02/01/2019    1:22 PM  Advanced Directives  Does Patient Have a Medical Advance Directive? Yes Yes  Type of Paramedic of [place];Living will [place]  Does patient want to make changes to medical advance directive? No - Patient declined No - Patient declined    Current Medications (verified) Outpatient Encounter Medications as of 10/01/2021  Medication Sig   aspirin 81 MG chewable tablet 1 tab   EPINEPHrine (EPIPEN 2-PAK) 0.3 mg/0.3 mL IJ SOAJ injection See admin instructions.   No facility-administered encounter medications on file as of 10/01/2021.    Allergies (verified) Patient has no known allergies.   History: No past medical history on file. Past Surgical History:  Procedure Laterality Date   CERVICAL DISCECTOMY  2001 &2005   NASAL SEPTUM SURGERY  1968   No family history on file. Social History   Socioeconomic History   Marital status: Married    Spouse name: Not on file   Number of children: Not on file   Years of education: Not on file   Highest education level: Not on file  Occupational History   Not on file  Tobacco Use   Smoking status: Never   Smokeless tobacco: Never  Vaping Use   Vaping Use: Never used  Substance and Sexual Activity   Alcohol use: Yes    Alcohol/week: 1.0  standard drink of alcohol    Types: 1 Cans of beer per week   Drug use: No   Sexual activity: Not on file  Other Topics Concern   Not on file  Social History Narrative   Not on file   Social Determinants of Health   Financial Resource Strain: Low Risk  (10/01/2021)   Overall Financial Resource Strain (CARDIA)    Difficulty of Paying Living Expenses: Not hard at all  Food Insecurity: No Food Insecurity (10/01/2021)   Hunger Vital Sign    Worried About Running Out of Food in the Last Year: Never true    Ran Out of Food in the Last Year: Never true  Transportation Needs: No Transportation Needs (10/01/2021)   PRAPARE - Hydrologist (Medical): No    Lack of Transportation (Non-Medical): No  Physical Activity: Insufficiently Active (10/01/2021)   Exercise Vital Sign    Days of Exercise per Week: 4 days    Minutes of Exercise per Session: 30 min  Stress: No Stress Concern Present (10/01/2021)   Laurie    Feeling of Stress : Not at all  Social Connections: Moderately Integrated (10/01/2021)   Social Connection and Isolation Panel [NHANES]    Frequency of Communication with Friends and Family: Three times a week    Frequency of Social Gatherings with Friends and  Family: Once a week    Attends Religious Services: Never    Marine scientist or Organizations: Yes    Attends Music therapist: More than 4 times per year    Marital Status: Married    Tobacco Counseling Counseling given: Not Answered   Clinical Intake:  Pre-visit preparation completed: Yes  Pain : No/denies pain Pain Score: 0-No pain     Diabetes: No  How often do you need to have someone help you when you read instructions, pamphlets, or other written materials from your doctor or pharmacy?: 1 - Never What is the last grade level you completed in school?: graduate school  Diabetic?no   Interpreter  Needed?: No      Activities of Daily Living    10/01/2021   11:50 AM 08/03/2021    2:15 PM  In your present state of health, do you have any difficulty performing the following activities:  Hearing? 0 0  Vision? 0 1  Comment  glasses  Difficulty concentrating or making decisions? 0 0  Walking or climbing stairs? 0 0  Dressing or bathing? 0 0  Doing errands, shopping? 0 0    Patient Care Team: Early, Coralee Pesa, NP as PCP - General (Nurse Practitioner) Warren Danes, PA-C as Physician Assistant (Dermatology)  Indicate any recent Medical Services you may have received from other than [facility] providers in the past year (date may be approximate).     Assessment:   This is a routine wellness examination for Wolfgang.  Hearing/Vision screen No results found.  Dietary issues and exercise activities discussed:     Goals Addressed   None   Depression Screen    10/01/2021   11:49 AM 08/03/2021    2:15 PM  PHQ 2/9 Scores  PHQ - 2 Score 0 0  Exception Documentation  Medical reason    Fall Risk    10/01/2021   11:50 AM 08/03/2021    2:15 PM  [place] in the past year? 0 0  Number falls in past yr: 0 0  Injury with Fall? 0 0  Risk for fall due to : No Fall Risks No Fall Risks  Follow up Falls evaluation completed Falls evaluation completed;Education provided    FALL RISK PREVENTION PERTAINING TO THE HOME:  Any stairs in or around the home? Yes  If so, are there any without handrails? No  Home free of loose throw rugs in walkways, pet beds, electrical cords, etc? Yes  Adequate lighting in your home to reduce risk of falls? Yes   ASSISTIVE DEVICES UTILIZED TO PREVENT FALLS:  Life alert? No  Use of a cane, walker or w/c? No  Grab bars in the bathroom? No  Shower chair or bench in shower? No  Elevated toilet seat or a handicapped toilet? No   Cognitive Function:        10/01/2021   11:52 AM  6CIT Screen  What Year? 0 points  What month? 0 points  What  time? 0 points  Count back from 20 0 points  Months in reverse 0 points  Repeat phrase 2 points  Total Score 2 points    Immunizations Immunization History  Administered Date(s) Administered   Influenza Split 11/17/2010, 11/28/2013, 10/23/2014, 10/21/2015, 10/07/2016, 10/25/2019   Moderna Sars-Covid-2 Vaccination 02/25/2019, 03/25/2019, 10/25/2019   Pneumococcal Conjugate-13 03/14/2013   Pneumococcal Polysaccharide-23 06/30/2010   Td 03/30/2016   Tdap 03/01/2006   Zoster, Live 06/30/2010, 01/25/2017, 04/26/2017  TDAP status: Up to date  Flu Vaccine status: Due, Education has been provided regarding the importance of this vaccine. Advised may receive this vaccine at local pharmacy or Health Dept. Aware to provide a copy of the vaccination record if obtained from local pharmacy or Health Dept. Verbalized acceptance and understanding.  Pneumococcal vaccine status: Up to date  Covid-19 vaccine status: Completed vaccines  Qualifies for Shingles Vaccine? Yes   Zostavax completed No   Shingrix Completed?: No.    Education has been provided regarding the importance of this vaccine. Patient has been advised to call insurance company to determine out of pocket expense if they have not yet received this vaccine. Advised may also receive vaccine at local pharmacy or Health Dept. Verbalized acceptance and understanding.  Screening Tests Health Maintenance  Topic Date Due   Hepatitis C Screening  Never done   Zoster Vaccines- Shingrix (1 of 2) Never done   COVID-19 Vaccine (4 - Moderna risk series) 12/20/2019   INFLUENZA VACCINE  10/25/2021 (Originally 08/24/2021)   TETANUS/TDAP  03/31/2026   Pneumonia Vaccine 65+ Years old  Completed   HPV VACCINES  Aged Out   COLONOSCOPY (Pts 45-54yr Insurance coverage will need to be confirmed)  Discontinued    Health Maintenance  Health Maintenance Due  Topic Date Due   Hepatitis C Screening  Never done   Zoster Vaccines- Shingrix (1 of 2)  Never done   COVID-19 Vaccine (4 - Moderna risk series) 12/20/2019    Colorectal cancer screening: No longer required.   Lung Cancer Screening: (Low Dose CT Chest recommended if Age 77-80years, 30 pack-year currently smoking OR have quit w/in 15years.) does qualify.   Lung Cancer Screening Referral: n/a  Additional Screening:  Hepatitis C Screening: does qualify; Completed n/a  Vision Screening: Recommended annual ophthalmology exams for early detection of glaucoma and other disorders of the eye. Is the patient up to date with their annual eye exam?  Yes  Who is the provider or what is the name of the office in which the patient attends annual eye exams? Dr bJola Schmidt If pt is not established with a provider, would they like to be referred to a provider to establish care? No .   Dental Screening: Recommended annual dental exams for proper oral hygiene  Community Resource Referral / Chronic Care Management: CRR required this visit?  No   CCM required this visit?  No      Plan:     I have personally reviewed and noted the following in the patient's chart:   Medical and social history Use of alcohol, tobacco or illicit drugs  Current medications and supplements including opioid prescriptions. Patient is not currently taking opioid prescriptions. Functional ability and status Nutritional status Physical activity Advanced directives List of other physicians Hospitalizations, surgeries, and ER visits in previous 12 months Vitals Screenings to include cognitive, depression, and falls Referrals and appointments  In addition, I have reviewed and discussed with patient certain preventive protocols, quality metrics, and best practice recommendations. A written personalized care plan for preventive services as well as general preventive health recommendations were provided to patient.     VDebbora Dus COregon  10/01/2021   Nurse Notes: provider notified.

## 2021-10-01 NOTE — Patient Instructions (Signed)
Health Maintenance, Male Adopting a healthy lifestyle and getting preventive care are important in promoting health and wellness. Ask your health care provider about: The right schedule for you to have regular tests and exams. Things you can do on your own to prevent diseases and keep yourself healthy. What should I know about diet, weight, and exercise? Eat a healthy diet  Eat a diet that includes plenty of vegetables, fruits, low-fat dairy products, and lean protein. Do not eat a lot of foods that are high in solid fats, added sugars, or sodium. Maintain a healthy weight Body mass index (BMI) is a measurement that can be used to identify possible weight problems. It estimates body fat based on height and weight. Your health care provider can help determine your BMI and help you achieve or maintain a healthy weight. Get regular exercise Get regular exercise. This is one of the most important things you can do for your health. Most adults should: Exercise for at least 150 minutes each week. The exercise should increase your heart rate and make you sweat (moderate-intensity exercise). Do strengthening exercises at least twice a week. This is in addition to the moderate-intensity exercise. Spend less time sitting. Even light physical activity can be beneficial. Watch cholesterol and blood lipids Have your blood tested for lipids and cholesterol at 77 years of age, then have this test every 5 years. You may need to have your cholesterol levels checked more often if: Your lipid or cholesterol levels are high. You are older than 77 years of age. You are at high risk for heart disease. What should I know about cancer screening? Many types of cancers can be detected early and may often be prevented. Depending on your health history and family history, you may need to have cancer screening at various ages. This may include screening for: Colorectal cancer. Prostate cancer. Skin cancer. Lung  cancer. What should I know about heart disease, diabetes, and high blood pressure? Blood pressure and heart disease High blood pressure causes heart disease and increases the risk of stroke. This is more likely to develop in people who have high blood pressure readings or are overweight. Talk with your health care provider about your target blood pressure readings. Have your blood pressure checked: Every 3-5 years if you are 18-39 years of age. Every year if you are 40 years old or older. If you are between the ages of 65 and 75 and are a current or former smoker, ask your health care provider if you should have a one-time screening for abdominal aortic aneurysm (AAA). Diabetes Have regular diabetes screenings. This checks your fasting blood sugar level. Have the screening done: Once every three years after age 45 if you are at a normal weight and have a low risk for diabetes. More often and at a younger age if you are overweight or have a high risk for diabetes. What should I know about preventing infection? Hepatitis B If you have a higher risk for hepatitis B, you should be screened for this virus. Talk with your health care provider to find out if you are at risk for hepatitis B infection. Hepatitis C Blood testing is recommended for: Everyone born from 1945 through 1965. Anyone with known risk factors for hepatitis C. Sexually transmitted infections (STIs) You should be screened each year for STIs, including gonorrhea and chlamydia, if: You are sexually active and are younger than 77 years of age. You are older than 77 years of age and your   health care provider tells you that you are at risk for this type of infection. Your sexual activity has changed since you were last screened, and you are at increased risk for chlamydia or gonorrhea. Ask your health care provider if you are at risk. Ask your health care provider about whether you are at high risk for HIV. Your health care provider  may recommend a prescription medicine to help prevent HIV infection. If you choose to take medicine to prevent HIV, you should first get tested for HIV. You should then be tested every 3 months for as long as you are taking the medicine. Follow these instructions at home: Alcohol use Do not drink alcohol if your health care provider tells you not to drink. If you drink alcohol: Limit how much you have to 0-2 drinks a day. Know how much alcohol is in your drink. In the [place] one drink equals one 12 oz bottle of beer (355 mL), one 5 oz glass of wine (148 mL), or one 1 oz glass of hard liquor (44 mL). Lifestyle Do not use any products that contain nicotine or tobacco. These products include cigarettes, chewing tobacco, and vaping devices, such as e-cigarettes. If you need help quitting, ask your health care provider. Do not use street drugs. Do not share needles. Ask your health care provider for help if you need support or information about quitting drugs. General instructions Schedule regular health, dental, and eye exams. Stay current with your vaccines. Tell your health care provider if: You often feel depressed. You have ever been abused or do not feel safe at home. Summary Adopting a healthy lifestyle and getting preventive care are important in promoting health and wellness. Follow your health care provider's instructions about healthy diet, exercising, and getting tested or screened for diseases. Follow your health care provider's instructions on monitoring your cholesterol and blood pressure. This information is not intended to replace advice given to you by your health care provider. Make sure you discuss any questions you have with your health care provider. Document Revised: 06/01/2020 Document Reviewed: 06/01/2020 Elsevier Patient Education  2023 Elsevier Inc.  

## 2021-10-06 DIAGNOSIS — G4733 Obstructive sleep apnea (adult) (pediatric): Secondary | ICD-10-CM | POA: Diagnosis not present

## 2021-10-22 DIAGNOSIS — G4733 Obstructive sleep apnea (adult) (pediatric): Secondary | ICD-10-CM | POA: Diagnosis not present

## 2021-11-16 ENCOUNTER — Encounter (INDEPENDENT_AMBULATORY_CARE_PROVIDER_SITE_OTHER): Payer: Medicare HMO | Admitting: Ophthalmology

## 2021-11-16 DIAGNOSIS — H353221 Exudative age-related macular degeneration, left eye, with active choroidal neovascularization: Secondary | ICD-10-CM | POA: Diagnosis not present

## 2021-11-16 DIAGNOSIS — H353231 Exudative age-related macular degeneration, bilateral, with active choroidal neovascularization: Secondary | ICD-10-CM | POA: Diagnosis not present

## 2021-11-16 DIAGNOSIS — H353112 Nonexudative age-related macular degeneration, right eye, intermediate dry stage: Secondary | ICD-10-CM | POA: Diagnosis not present

## 2021-11-16 DIAGNOSIS — G4733 Obstructive sleep apnea (adult) (pediatric): Secondary | ICD-10-CM | POA: Diagnosis not present

## 2021-11-17 ENCOUNTER — Encounter: Payer: Self-pay | Admitting: Gastroenterology

## 2021-11-22 DIAGNOSIS — H353122 Nonexudative age-related macular degeneration, left eye, intermediate dry stage: Secondary | ICD-10-CM | POA: Diagnosis not present

## 2021-11-22 DIAGNOSIS — H353211 Exudative age-related macular degeneration, right eye, with active choroidal neovascularization: Secondary | ICD-10-CM | POA: Diagnosis not present

## 2021-11-22 DIAGNOSIS — H353221 Exudative age-related macular degeneration, left eye, with active choroidal neovascularization: Secondary | ICD-10-CM | POA: Diagnosis not present

## 2021-11-22 DIAGNOSIS — H353112 Nonexudative age-related macular degeneration, right eye, intermediate dry stage: Secondary | ICD-10-CM | POA: Diagnosis not present

## 2021-11-22 DIAGNOSIS — G4733 Obstructive sleep apnea (adult) (pediatric): Secondary | ICD-10-CM | POA: Diagnosis not present

## 2021-12-27 DIAGNOSIS — H353211 Exudative age-related macular degeneration, right eye, with active choroidal neovascularization: Secondary | ICD-10-CM | POA: Diagnosis not present

## 2021-12-27 DIAGNOSIS — G4733 Obstructive sleep apnea (adult) (pediatric): Secondary | ICD-10-CM | POA: Diagnosis not present

## 2021-12-27 DIAGNOSIS — H353132 Nonexudative age-related macular degeneration, bilateral, intermediate dry stage: Secondary | ICD-10-CM | POA: Diagnosis not present

## 2021-12-27 DIAGNOSIS — H353221 Exudative age-related macular degeneration, left eye, with active choroidal neovascularization: Secondary | ICD-10-CM | POA: Diagnosis not present

## 2022-01-07 ENCOUNTER — Ambulatory Visit (INDEPENDENT_AMBULATORY_CARE_PROVIDER_SITE_OTHER): Payer: Medicare HMO | Admitting: Nurse Practitioner

## 2022-01-07 ENCOUNTER — Ambulatory Visit: Payer: Self-pay | Admitting: Nurse Practitioner

## 2022-01-07 VITALS — BP 120/68 | HR 62 | Ht 70.5 in | Wt 174.6 lb

## 2022-01-07 DIAGNOSIS — R197 Diarrhea, unspecified: Secondary | ICD-10-CM | POA: Diagnosis not present

## 2022-01-07 NOTE — Progress Notes (Signed)
  Orma Render, DNP, AGNP-c [facility] [address] [place], [place] [address] [phone]  Subjective:   Joseph Moses is a 76 y.o. male presents to day for evaluation of: Acute Diarrhea Joseph Moses endorses sudden onset of diarrhea and abdominal cramping earlier this week. He tells me the symptoms were pretty bad at the time that he made the appointment, but slowly began to resolve Wednesday evening into yesterday morning. He reports shortly before feeling ill he ate chicken. He denies blood in the stool, fevers, chills, or vomiting. At this time the symptoms have resolved. He has been drinking plenty of fluids to ensure he stays hydrated. He is not sure if he might need an antibiotic.   PMH, Medications, and Allergies reviewed and updated in chart as appropriate.   ROS negative except for what is listed in HPI. Objective:  BP 120/68 (BP Location: Right Arm, Patient Position: Sitting)   Pulse 62   Ht 5' 10.5" (1.791 m)   Wt 174 lb 9.6 oz (79.2 kg)   SpO2 97%   BMI 24.70 kg/m  Physical Exam Vitals and nursing note reviewed.  Constitutional:      Appearance: Normal appearance. He is not ill-appearing.  HENT:     Head: Normocephalic.  Eyes:     Pupils: Pupils are equal, round, and reactive to light.  Cardiovascular:     Rate and Rhythm: Normal rate and regular rhythm.     Pulses: Normal pulses.     Heart sounds: Normal heart sounds.  Pulmonary:     Effort: Pulmonary effort is normal.     Breath sounds: Normal breath sounds.  Abdominal:     General: Abdomen is flat. Bowel sounds are normal. There is no distension.     Palpations: Abdomen is soft.     Tenderness: There is no abdominal tenderness. There is no right CVA tenderness, left CVA tenderness or guarding.  Musculoskeletal:        General: Normal range of motion.  Skin:    General: Skin is warm and dry.     Capillary Refill: Capillary refill takes less than 2 seconds.  Neurological:     General: No  focal deficit present.     Mental Status: He is alert.  Psychiatric:        Mood and Affect: Mood normal.           Assessment & Plan:   Problem List Items Addressed This Visit     Diarrhea - Primary    Given the sudden onset of symptoms following ingestion of poultry from fast food restaurant. Suspect that he may have suffered from food poisoning given the presentation of illness and course. At this time he is no longer having any symptoms. His vitals are stable and there is not concern with limited intake. I do not feel that antibiotic therapy is necessary at this time, but do recommend that he contact us immediately if he has return of symptoms and we can certainly obtain a stool culture for further evaluation. We will plan to monitor at this time.          Orma Render, DNP, AGNP-c 01/12/2022  4:31 PM    History, Medications, Surgery, SDOH, and Family History reviewed and updated as appropriate.

## 2022-01-12 ENCOUNTER — Encounter: Payer: Self-pay | Admitting: Nurse Practitioner

## 2022-01-12 DIAGNOSIS — R197 Diarrhea, unspecified: Secondary | ICD-10-CM | POA: Insufficient documentation

## 2022-01-12 HISTORY — DX: Diarrhea, unspecified: R19.7

## 2022-01-12 NOTE — Assessment & Plan Note (Signed)
Given the sudden onset of symptoms following ingestion of poultry from [employer]. Suspect that he may have suffered from food poisoning given the presentation of illness and course. At this time he is no longer having any symptoms. His vitals are stable and there is not concern with limited intake. I do not feel that antibiotic therapy is necessary at this time, but do recommend that he contact us immediately if he has return of symptoms and we can certainly obtain a stool culture for further evaluation. We will plan to monitor at this time.

## 2022-01-13 ENCOUNTER — Ambulatory Visit: Payer: Self-pay | Admitting: Nurse Practitioner

## 2022-01-21 DIAGNOSIS — G4733 Obstructive sleep apnea (adult) (pediatric): Secondary | ICD-10-CM | POA: Diagnosis not present

## 2022-01-25 ENCOUNTER — Ambulatory Visit (INDEPENDENT_AMBULATORY_CARE_PROVIDER_SITE_OTHER): Payer: Medicare HMO | Admitting: Nurse Practitioner

## 2022-01-25 ENCOUNTER — Encounter: Payer: Self-pay | Admitting: Nurse Practitioner

## 2022-01-25 VITALS — BP 130/70 | HR 60 | Ht 70.5 in | Wt 179.2 lb

## 2022-01-25 DIAGNOSIS — H353122 Nonexudative age-related macular degeneration, left eye, intermediate dry stage: Secondary | ICD-10-CM | POA: Diagnosis not present

## 2022-01-25 DIAGNOSIS — H353132 Nonexudative age-related macular degeneration, bilateral, intermediate dry stage: Secondary | ICD-10-CM | POA: Diagnosis not present

## 2022-01-25 DIAGNOSIS — H353112 Nonexudative age-related macular degeneration, right eye, intermediate dry stage: Secondary | ICD-10-CM | POA: Diagnosis not present

## 2022-01-25 DIAGNOSIS — Z0000 Encounter for general adult medical examination without abnormal findings: Secondary | ICD-10-CM | POA: Diagnosis not present

## 2022-01-25 DIAGNOSIS — H353211 Exudative age-related macular degeneration, right eye, with active choroidal neovascularization: Secondary | ICD-10-CM | POA: Diagnosis not present

## 2022-01-25 DIAGNOSIS — N400 Enlarged prostate without lower urinary tract symptoms: Secondary | ICD-10-CM

## 2022-01-25 DIAGNOSIS — I340 Nonrheumatic mitral (valve) insufficiency: Secondary | ICD-10-CM | POA: Diagnosis not present

## 2022-01-25 DIAGNOSIS — G4733 Obstructive sleep apnea (adult) (pediatric): Secondary | ICD-10-CM

## 2022-01-25 DIAGNOSIS — H353221 Exudative age-related macular degeneration, left eye, with active choroidal neovascularization: Secondary | ICD-10-CM | POA: Diagnosis not present

## 2022-01-25 LAB — POCT URINALYSIS DIP (PROADVANTAGE DEVICE)
Bilirubin, UA: NEGATIVE
Blood, UA: NEGATIVE
Glucose, UA: NEGATIVE mg/dL
Ketones, POC UA: NEGATIVE mg/dL
Leukocytes, UA: NEGATIVE
Nitrite, UA: NEGATIVE
Protein Ur, POC: NEGATIVE mg/dL
Specific Gravity, Urine: 1.015
Urobilinogen, Ur: 0.2
pH, UA: 6 (ref 5.0–8.0)

## 2022-01-25 NOTE — Progress Notes (Unsigned)
BP 130/70   Pulse 60   Ht 5' 10.5" (1.791 m)   Wt 179 lb 3.2 oz (81.3 kg)   BMI 25.35 kg/m    Subjective:    Patient ID: Joseph Moses, male    DOB: November 04, 1945, 77 y.o.   MRN: [id number]  HPI: Joseph Moses is a 77 y.o. male presenting on 01/25/2022 for comprehensive medical examination.   IMMUNIZATIONS:   {Flu:28474:o:"Flu vaccine completed elsewhere this season"} {Prevnar 13:28477:o:"Prevnar 13 N/A for this patient"} {Prevnar 20:28476:o:"Prevnar 20 N/A for this patient"} {Pneumovax 23:28475:o:"Pneumovax 23 N/A for this patient"} {Vac Shingrix:28478:o:"Shingrix N/A for this patient"} {HPV:28479:o:"HPV N/A for this patient"} {Tetanus:28480:o:"Tetanus completed in the last 10 years"}  HEALTH MAINTENANCE: Colon Cancer Screening {HM Status:28473:o} STI Testing {HM Status:28473:o} PSA Screen {HM Status:28473:o} Lung Ca Screen {HM Status:28473:o}  He reports regular vision exams q1-5y: {YES/NO:21197::"Yes "} He reports regular dental exams q 59m  {YES/NO:21197::"Yes "} {Daily diet habits:20576::"The patient eats a regular, healthy diet."} He endorses exercise and/or activity of: {Blank single:19197::"***"}  He currently: {SESOCIALHISTORY:28582}  Current medical concerns include: {Blank single:19197::"none","***"}  {Ros; complete male:19585:a:"Pertinent items are noted in HPI."}  Most Recent Depression Screen:     01/25/2022    2:27 PM 10/01/2021   11:49 AM 08/03/2021    2:15 PM  Depression screen PHQ 2/9  Decreased Interest 0 0 0  Down, Depressed, Hopeless 0 0 0  PHQ - 2 Score 0 0 0   Most Recent Anxiety Screen:     No data to display         Most Recent Falls Screen:    01/25/2022    2:27 PM 10/01/2021   11:50 AM 08/03/2021    2:15 PM  FJayuyain the past year? 0 0 0  Number falls in past yr: 0 0 0  Injury with Fall? 0 0 0  Risk for fall due to : No Fall Risks No Fall Risks No Fall Risks  Follow up Falls evaluation completed Falls evaluation  completed Falls evaluation completed;Education provided    Past medical history, surgical history, medications, allergies, family history and social history reviewed with patient today and changes made to appropriate areas of the chart.  Past Medical History:  No past medical history on file. Medications:  Current Outpatient Medications on File Prior to Visit  Medication Sig   aspirin 81 MG chewable tablet 1 tab   Magnesium 250 MG TABS Take 1 tablet by mouth daily.   vitamin B-12 (CYANOCOBALAMIN) 500 MCG tablet Take 500 mcg by mouth daily.   EPINEPHrine (EPIPEN 2-PAK) 0.3 mg/0.3 mL IJ SOAJ injection See admin instructions. (Patient not taking: Reported on 01/25/2022)   No current facility-administered medications on file prior to visit.   Surgical History:  Past Surgical History:  Procedure Laterality Date   CERVICAL DISCECTOMY  2001 &2005   NASAL SEPTUM SURGERY  1968   Allergies:  No Known Allergies Social History:  Social History   Socioeconomic History   Marital status: Married    Spouse name: Not on file   Number of children: Not on file   Years of education: Not on file   Highest education level: Not on file  Occupational History   Not on file  Tobacco Use   Smoking status: Never   Smokeless tobacco: Never  Vaping Use   Vaping Use: Never used  Substance and Sexual Activity   Alcohol use: Yes    Alcohol/week: 1.0 standard drink of alcohol  Types: 1 Cans of beer per week   Drug use: No   Sexual activity: Not on file  Other Topics Concern   Not on file  Social History Narrative   Not on file   Social Determinants of Health   Financial Resource Strain: Low Risk  (10/01/2021)   Overall Financial Resource Strain (CARDIA)    Difficulty of Paying Living Expenses: Not hard at all  Food Insecurity: No Food Insecurity (10/01/2021)   Hunger Vital Sign    Worried About Running Out of Food in the Last Year: Never true    Ran Out of Food in the Last Year: Never true   Transportation Needs: No Transportation Needs (10/01/2021)   PRAPARE - Hydrologist (Medical): No    Lack of Transportation (Non-Medical): No  Physical Activity: Insufficiently Active (10/01/2021)   Exercise Vital Sign    Days of Exercise per Week: 4 days    Minutes of Exercise per Session: 30 min  Stress: No Stress Concern Present (10/01/2021)   [place]    Feeling of Stress : Not at all  Social Connections: Moderately Integrated (10/01/2021)   Social Connection and Isolation Panel [NHANES]    Frequency of Communication with Friends and Family: Three times a week    Frequency of Social Gatherings with Friends and Family: Once a week    Attends Religious Services: Never    Marine scientist or Organizations: Yes    Attends Music therapist: More than 4 times per year    Marital Status: Married  Human resources officer Violence: Not At Risk (10/01/2021)   Humiliation, Afraid, Rape, and Kick questionnaire    Fear of Current or Ex-Partner: No    Emotionally Abused: No    Physically Abused: No    Sexually Abused: No   Social History   Tobacco Use  Smoking Status Never  Smokeless Tobacco Never   Social History   Substance and Sexual Activity  Alcohol Use Yes   Alcohol/week: 1.0 standard drink of alcohol   Types: 1 Cans of beer per week   Family History:  No family history on file.     Objective:    BP 130/70   Pulse 60   Ht 5' 10.5" (1.791 m)   Wt 179 lb 3.2 oz (81.3 kg)   BMI 25.35 kg/m   Wt Readings from Last 3 Encounters:  01/25/22 179 lb 3.2 oz (81.3 kg)  01/07/22 174 lb 9.6 oz (79.2 kg)  08/03/21 174 lb 6.4 oz (79.1 kg)    Physical Exam  No results found for this or any previous visit.    Assessment & Plan:   Problem List Items Addressed This Visit   None Visit Diagnoses     Annual physical exam    -  Primary   Relevant Orders   POCT Urinalysis DIP  (Proadvantage Device)        Follow up plan: NEXT PREVENTATIVE PHYSICAL DUE IN 1 YEAR. No follow-ups on file.  LABORATORY TESTING:  Health maintenance labs ordered today, if applicable.  - STI testing: {Blank PYKDXI:33825::"KNLZJQBH","ALPF today","not applicable","up to date","done elsewhere"}  IMMUNIZATIONS:   - Tdap: Tetanus vaccination status reviewed: {tetanus status:315746}. - Influenza: {Blank single:19197::"Up to date","Administered today","Postponed to flu season","Refused","Given elsewhere"} - Pneumovax: {Blank single:19197::"Up to date","Administered today","Not applicable","Refused","Given elsewhere"} - Prevnar: {Blank single:19197::"Up to date","Administered today","Not applicable","Refused","Given elsewhere"} - HPV: {Blank single:19197::"Up to date","Administered today","Not applicable","Refused","Given elsewhere"} - Zostavax vaccine: {  Blank single:19197::"Up to date","Administered today","Not applicable","Refused","Given elsewhere"}  SCREENING: - Colonoscopy: {Blank single:19197::"Up to date","Ordered today","Not applicable","Refused","Done elsewhere"}  Discussed with patient purpose of the colonoscopy is to detect colon cancer at curable precancerous or Mardi Cannady stages  - AAA Screening: {Blank single:19197::"Up to date","Ordered today","Not applicable","Refused","Done elsewhere"}  - Hearing Test: {Blank single:19197::"Up to date","Ordered today","Not applicable","Refused","Done elsewhere"}  - Spirometry: {Blank single:19197::"Up to date","Ordered today","Not applicable","Refused","Done elsewhere"}  - PSA: {Blank single:19197::"Up to date","Ordered today","Not applicable","Refused","Done elsewhere"}   PATIENT COUNSELING:   For all adult patients, I recommend A well balanced diet low in saturated fats, cholesterol, and moderation in carbohydrates.   This can be as simple as monitoring portion sizes and cutting back on sugary beverages such as soda and juice to start with.     Daily water consumption of at least 64 ounces.  Physical activity at least 180 minutes per week, if just starting out.   This can be as simple as taking the stairs instead of the elevator and walking 2-3 laps around the office  purposefully every day.   STD protection, partner selection, and regular testing if high risk.  Limited consumption of alcoholic beverages if alcohol is consumed.  For women, I recommend no more than 7 alcoholic beverages per week, spread out throughout the week.  Avoid "binge" drinking or consuming large quantities of alcohol in one setting.   Please let me know if you feel you may need help with reduction or quitting alcohol consumption.   Avoidance of nicotine, if used.  Please let me know if you feel you may need help with reduction or quitting nicotine use.   Daily mental health attention.  This can be in the form of 5 minute daily meditation, prayer, journaling, yoga, reflection, etc.   Purposeful attention to your emotions and mental state can significantly improve your overall wellbeing  and  Health.  Please know that I am here to help you with all of your health care goals and am happy to work with you to find a solution that works best for you.  The greatest advice I have received with any changes in life are to take it one step at a time, that even means if all you can focus on is the next 60 seconds, then do that and celebrate your victories.  With any changes in life, you will have set backs, and that is OK. The important thing to remember is, if you have a set back, it is not a failure, it is an opportunity to try again!  Health Maintenance Recommendations Screening Testing Mammogram Every 1 -2 years based on history and risk factors Starting at age 47 Pap Smear Ages 21-39 every 3 years Ages 51-65 every 5 years with HPV testing More frequent testing may be required based on results and history Colon Cancer Screening Every 1-10 years based on  test performed, risk factors, and history Starting at age 62 Bone Density Screening Every 2-10 years based on history Starting at age 8 for women Recommendations for men differ based on medication usage, history, and risk factors AAA Screening One time ultrasound Men 76-65 years old who have every smoked Lung Cancer Screening Low Dose Lung CT every 12 months Age 60-80 years with a 30 pack-year smoking history who still smoke or who have quit within the last 15 years  Screening Labs Routine  Labs: Complete Blood Count (CBC), Complete Metabolic Panel (CMP), Cholesterol (Lipid Panel) Every 6-12 months based on history and medications May be recommended more  frequently based on current conditions or previous results Hemoglobin A1c Lab Every 3-12 months based on history and previous results Starting at age 64 or earlier with diagnosis of diabetes, high cholesterol, BMI >26, and/or risk factors Frequent monitoring for patients with diabetes to ensure blood sugar control Thyroid Panel (TSH w/ T3 & T4) Every 6 months based on history, symptoms, and risk factors May be repeated more often if on medication HIV One time testing for all patients 85 and older May be repeated more frequently for patients with increased risk factors or exposure Hepatitis C One time testing for all patients 57 and older May be repeated more frequently for patients with increased risk factors or exposure Gonorrhea, Chlamydia Every 12 months for all sexually active persons 13-24 years Additional monitoring may be recommended for those who are considered high risk or who have symptoms PSA Men 11-29 years old with risk factors Additional screening may be recommended from age 63-69 based on risk factors, symptoms, and history  Vaccine Recommendations Tetanus Booster All adults every 10 years Flu Vaccine All patients 6 months and older every year COVID Vaccine All patients 12 years and older Initial dosing  with booster May recommend additional booster based on age and health history HPV Vaccine 2 doses all patients age 2-26 Dosing may be considered for patients over 26 Shingles Vaccine (Shingrix) 2 doses all adults 56 years and older Pneumonia (Pneumovax 23) All adults 21 years and older May recommend earlier dosing based on health history Pneumonia (Prevnar 54) All adults 52 years and older Dosed 1 year after Pneumovax 23  Additional Screening, Testing, and Vaccinations may be recommended on an individualized basis based on family history, health history, risk factors, and/or exposure.

## 2022-01-25 NOTE — Patient Instructions (Signed)
It was a pleasure seeing you today! Thank you for trusting me with your care.   Things are looking great today!  If labs were collected today, you will see the results as soon as they become available in [place]. I will review these labs once I have received all of the results and send you comments and recommendations, if any. If you have specific concerns, we can set up an appointment (virtual or in person) to go over details and come up with a plan together.   If you have received any referrals today, the office where the referral was made will be in contact with you to set up your appointment.   If you have received orders for imaging today, the imaging office will contact you to schedule this. Please note that some imaging requires a prior authorization from insurance, so an order is not a guarantee this will be covered by your insurance, but I want to assure you we will do our best to get this covered and if it is not, you will be notified and we can come up with an alternative plan.   If you take regular prescription medications, please contact your pharmacy for routine refill requests. They will send this directly to [place].  If you were ordered new medication as a part of your examination and treatment today, please contact your pharmacy to determine the status. Many prescriptions require a prior authorization and the pharmacy will contact us to get this started. This may take a few days for approval or denial. If the medication is denied, we will work with you to try alternative medications.   If you have any questions or concerns, please do not hesitate to contact the office via telephone or [place].  MyChart messages are received by the [facility] staff during regular business hours Monday through Friday and we do our best to respond in a timely manner.  If your request requires an appointment, the staff will gladly help set that up so that we have the time dedicated to ensure that your questions are  appropriately answered.

## 2022-01-26 DIAGNOSIS — Z0000 Encounter for general adult medical examination without abnormal findings: Secondary | ICD-10-CM | POA: Insufficient documentation

## 2022-01-26 LAB — COMPREHENSIVE METABOLIC PANEL
ALT: 16 IU/L (ref 0–44)
AST: 22 IU/L (ref 0–40)
Albumin/Globulin Ratio: 1.5 (ref 1.2–2.2)
Albumin: 4.3 g/dL (ref 3.8–4.8)
Alkaline Phosphatase: 82 IU/L (ref 44–121)
BUN/Creatinine Ratio: 16 (ref 10–24)
BUN: 20 mg/dL (ref 8–27)
Bilirubin Total: 0.6 mg/dL (ref 0.0–1.2)
CO2: 28 mmol/L (ref 20–29)
Calcium: 10.3 mg/dL — ABNORMAL HIGH (ref 8.6–10.2)
Chloride: 103 mmol/L (ref 96–106)
Creatinine, Ser: 1.23 mg/dL (ref 0.76–1.27)
Globulin, Total: 2.9 g/dL (ref 1.5–4.5)
Glucose: 77 mg/dL (ref 70–99)
Potassium: 5.3 mmol/L — ABNORMAL HIGH (ref 3.5–5.2)
Sodium: 141 mmol/L (ref 134–144)
Total Protein: 7.2 g/dL (ref 6.0–8.5)
eGFR: 60 mL/min/{1.73_m2} (ref >59)

## 2022-01-26 LAB — CBC WITH DIFFERENTIAL/PLATELET
Basophils Absolute: 0.1 10*3/uL (ref 0.0–0.2)
Basos: 2 %
EOS (ABSOLUTE): 0.2 10*3/uL (ref 0.0–0.4)
Eos: 2 %
Hematocrit: 45.6 % (ref 37.5–51.0)
Hemoglobin: 15.6 g/dL (ref 13.0–17.7)
Immature Grans (Abs): 0 10*3/uL (ref 0.0–0.1)
Immature Granulocytes: 0 %
Lymphocytes Absolute: 1.9 10*3/uL (ref 0.7–3.1)
Lymphs: 27 %
MCH: 30.6 pg (ref 26.6–33.0)
MCHC: 34.2 g/dL (ref 31.5–35.7)
MCV: 89 fL (ref 79–97)
Monocytes Absolute: 0.6 10*3/uL (ref 0.1–0.9)
Monocytes: 9 %
Neutrophils Absolute: 4.1 10*3/uL (ref 1.4–7.0)
Neutrophils: 60 %
Platelets: 319 10*3/uL (ref 150–450)
RBC: 5.1 x10E6/uL (ref 4.14–5.80)
RDW: 13.5 % (ref 11.6–15.4)
WBC: 6.8 10*3/uL (ref 3.4–10.8)

## 2022-01-26 LAB — HEMOGLOBIN A1C
Est. average glucose Bld gHb Est-mCnc: 120 mg/dL
Hgb A1c MFr Bld: 5.8 % — ABNORMAL HIGH (ref 4.8–5.6)

## 2022-01-26 NOTE — Assessment & Plan Note (Signed)

## 2022-01-31 DIAGNOSIS — H353211 Exudative age-related macular degeneration, right eye, with active choroidal neovascularization: Secondary | ICD-10-CM | POA: Diagnosis not present

## 2022-01-31 DIAGNOSIS — H353112 Nonexudative age-related macular degeneration, right eye, intermediate dry stage: Secondary | ICD-10-CM | POA: Diagnosis not present

## 2022-01-31 DIAGNOSIS — H353221 Exudative age-related macular degeneration, left eye, with active choroidal neovascularization: Secondary | ICD-10-CM | POA: Diagnosis not present

## 2022-01-31 DIAGNOSIS — G4733 Obstructive sleep apnea (adult) (pediatric): Secondary | ICD-10-CM | POA: Diagnosis not present

## 2022-03-14 DIAGNOSIS — H353221 Exudative age-related macular degeneration, left eye, with active choroidal neovascularization: Secondary | ICD-10-CM | POA: Diagnosis not present

## 2022-03-14 DIAGNOSIS — H353122 Nonexudative age-related macular degeneration, left eye, intermediate dry stage: Secondary | ICD-10-CM | POA: Diagnosis not present

## 2022-03-14 DIAGNOSIS — H353211 Exudative age-related macular degeneration, right eye, with active choroidal neovascularization: Secondary | ICD-10-CM | POA: Diagnosis not present

## 2022-04-05 DIAGNOSIS — H353211 Exudative age-related macular degeneration, right eye, with active choroidal neovascularization: Secondary | ICD-10-CM | POA: Diagnosis not present

## 2022-04-05 DIAGNOSIS — H353221 Exudative age-related macular degeneration, left eye, with active choroidal neovascularization: Secondary | ICD-10-CM | POA: Diagnosis not present

## 2022-04-11 DIAGNOSIS — R69 Illness, unspecified: Secondary | ICD-10-CM | POA: Diagnosis not present

## 2022-04-14 ENCOUNTER — Ambulatory Visit (INDEPENDENT_AMBULATORY_CARE_PROVIDER_SITE_OTHER): Payer: Medicare HMO | Admitting: Nurse Practitioner

## 2022-04-14 ENCOUNTER — Encounter: Payer: Self-pay | Admitting: Nurse Practitioner

## 2022-04-14 VITALS — BP 118/72 | HR 60 | Wt 181.2 lb

## 2022-04-14 DIAGNOSIS — H65191 Other acute nonsuppurative otitis media, right ear: Secondary | ICD-10-CM | POA: Diagnosis not present

## 2022-04-14 DIAGNOSIS — H9311 Tinnitus, right ear: Secondary | ICD-10-CM

## 2022-04-14 DIAGNOSIS — H6122 Impacted cerumen, left ear: Secondary | ICD-10-CM | POA: Diagnosis not present

## 2022-04-14 MED ORDER — FLUTICASONE PROPIONATE 50 MCG/ACT NA SUSP: 2.0000 | Freq: Every day | NASAL | 6 refills | Status: DC

## 2022-04-14 NOTE — Progress Notes (Signed)
Orma Render, DNP, AGNP-c [facility] [address] [place], [place] [address] [phone]  Subjective:   Joseph Moses is a 77 y.o. male presents to day for evaluation of: Joseph Moses presents today with a chief complaint of a humming sound in his right ear that began last week. He describes the sound as intermittent and notes that it does not occur when he changes the position of his head. The patient denies experiencing any sinus symptoms or ear pain and confirms that the issue is isolated to his right ear. There is a family history of hearing loss, as Joseph Moses mentions that his father and paternal uncles developed hearing loss at a certain age, indicating a potential hereditary factor.  In discussing his lifestyle and habits, Joseph Moses indicates that he uses a CPAP machine for sleep and typically favors sleeping on his left side. He expresses uncertainty regarding whether the CPAP machine might be contributing to the auditory disturbance. Joseph Moses is amenable to the idea of using a nasal spray as a potential remedy and agrees to a follow-up appointment in 10 days should the humming sound persist.  PMH, Medications, and Allergies reviewed and updated in chart as appropriate.   ROS negative except for what is listed in HPI. Objective:  BP 118/72   Pulse 60   Wt 181 lb 3.2 oz (82.2 kg)   BMI 25.63 kg/m  Physical Exam Vitals and nursing note reviewed.  Constitutional:      Appearance: Normal appearance.  HENT:     Head: Normocephalic and atraumatic.     Right Ear: Hearing normal. A middle ear effusion is present.     Left Ear: There is impacted cerumen.     Nose: Nose normal.     Mouth/Throat:     Mouth: Mucous membranes are moist.     Pharynx: Oropharynx is clear.  Eyes:     Pupils: Pupils are equal, round, and reactive to light.  Cardiovascular:     Rate and Rhythm: Normal rate and regular rhythm.     Pulses: Normal pulses.     Heart sounds: Normal heart sounds.   Pulmonary:     Effort: Pulmonary effort is normal.     Breath sounds: Normal breath sounds.  Skin:    General: Skin is warm and dry.     Capillary Refill: Capillary refill takes less than 2 seconds.  Neurological:     General: No focal deficit present.     Mental Status: He is alert and oriented to person, place, and time.     Cranial Nerves: No cranial nerve deficit.     Sensory: No sensory deficit.  Psychiatric:        Mood and Affect: Mood normal.           Assessment & Plan:   Problem List Items Addressed This Visit     Tinnitus of right ear   Acute MEE (middle ear effusion), right - Primary    The patient experiences a humming sound in the right ear that began last week. Audiometric testing indicates mild hearing loss in the right ear at specific frequencies. Otoscopic examination reveals fluid behind the right eardrum, with no infection evident. At this time, it is not clear if the humming sound is due to effusion present or possible tinnitus. Given the family history of hearing loss, I have low threshold for referral to audiology.  Plan: - Advise the use of Fluticasone nasal spray available over-the-counter to alleviate inflammation and aid in  the drainage of the fluid. - Call the office in 10 days if the tinnitus does not improve or worsens, at which time we will place a referral to an audiologist.      Relevant Medications   fluticasone (FLONASE) 50 MCG/ACT nasal spray   Impacted cerumen of left ear    Impacted cerumen is observed in the left ear canal during otoscopic examination, potentially affecting the patient's hearing. The patient's ear canal shape may increase the risk of wax accumulation. Plan: - Conduct ear irrigation to remove the impacted cerumen. - Provide education on ear care and discuss factors such as sleeping position that may influence wax buildup. - Suggest a subsequent audiology consultation if hearing concerns persist post-cerumen removal.          Orma Render, DNP, AGNP-c 04/18/2022  8:26 PM    History, Medications, Surgery, SDOH, and Family History reviewed and updated as appropriate.

## 2022-04-14 NOTE — Patient Instructions (Signed)
Let me know if the sound in the ear does not get better after using the flonase for 10-14 days and we can send a referral to audiology.

## 2022-04-18 DIAGNOSIS — H6122 Impacted cerumen, left ear: Secondary | ICD-10-CM | POA: Insufficient documentation

## 2022-04-18 DIAGNOSIS — H65191 Other acute nonsuppurative otitis media, right ear: Secondary | ICD-10-CM | POA: Insufficient documentation

## 2022-04-18 DIAGNOSIS — H9311 Tinnitus, right ear: Secondary | ICD-10-CM | POA: Insufficient documentation

## 2022-04-18 HISTORY — DX: Other acute nonsuppurative otitis media, right ear: H65.191

## 2022-04-18 HISTORY — DX: Impacted cerumen, left ear: H61.22

## 2022-04-18 NOTE — Assessment & Plan Note (Signed)
Impacted cerumen is observed in the left ear canal during otoscopic examination, potentially affecting the patient's hearing. The patient's ear canal shape may increase the risk of wax accumulation. Plan: - Conduct ear irrigation to remove the impacted cerumen. - Provide education on ear care and discuss factors such as sleeping position that may influence wax buildup. - Suggest a subsequent audiology consultation if hearing concerns persist post-cerumen removal.

## 2022-04-18 NOTE — Assessment & Plan Note (Signed)
The patient experiences a humming sound in the right ear that began last week. Audiometric testing indicates mild hearing loss in the right ear at specific frequencies. Otoscopic examination reveals fluid behind the right eardrum, with no infection evident. At this time, it is not clear if the humming sound is due to effusion present or possible tinnitus. Given the family history of hearing loss, I have low threshold for referral to audiology.  Plan: - Advise the use of Fluticasone nasal spray available over-the-counter to alleviate inflammation and aid in the drainage of the fluid. - Call the office in 10 days if the tinnitus does not improve or worsens, at which time we will place a referral to an audiologist.

## 2022-04-20 DIAGNOSIS — H353132 Nonexudative age-related macular degeneration, bilateral, intermediate dry stage: Secondary | ICD-10-CM | POA: Diagnosis not present

## 2022-04-20 DIAGNOSIS — H353211 Exudative age-related macular degeneration, right eye, with active choroidal neovascularization: Secondary | ICD-10-CM | POA: Diagnosis not present

## 2022-04-20 DIAGNOSIS — H353221 Exudative age-related macular degeneration, left eye, with active choroidal neovascularization: Secondary | ICD-10-CM | POA: Diagnosis not present

## 2022-04-29 DIAGNOSIS — N400 Enlarged prostate without lower urinary tract symptoms: Secondary | ICD-10-CM | POA: Diagnosis not present

## 2022-04-29 DIAGNOSIS — R972 Elevated prostate specific antigen [PSA]: Secondary | ICD-10-CM | POA: Diagnosis not present

## 2022-05-03 ENCOUNTER — Telehealth: Payer: Self-pay | Admitting: Nurse Practitioner

## 2022-05-03 DIAGNOSIS — G4733 Obstructive sleep apnea (adult) (pediatric): Secondary | ICD-10-CM

## 2022-05-03 NOTE — Telephone Encounter (Signed)
Order printed and ready to be faxed to [facility] [phone]

## 2022-05-03 NOTE — Telephone Encounter (Signed)
Pt called and he left message that he needs CPAP rx and insurance information Over to [facility]    fax to [phone]

## 2022-05-11 ENCOUNTER — Ambulatory Visit: Payer: Medicare HMO | Admitting: Physician Assistant

## 2022-06-01 DIAGNOSIS — H353122 Nonexudative age-related macular degeneration, left eye, intermediate dry stage: Secondary | ICD-10-CM | POA: Diagnosis not present

## 2022-06-01 DIAGNOSIS — H353221 Exudative age-related macular degeneration, left eye, with active choroidal neovascularization: Secondary | ICD-10-CM | POA: Diagnosis not present

## 2022-06-01 DIAGNOSIS — H353211 Exudative age-related macular degeneration, right eye, with active choroidal neovascularization: Secondary | ICD-10-CM | POA: Diagnosis not present

## 2022-06-01 DIAGNOSIS — H353112 Nonexudative age-related macular degeneration, right eye, intermediate dry stage: Secondary | ICD-10-CM | POA: Diagnosis not present

## 2022-06-14 DIAGNOSIS — H353211 Exudative age-related macular degeneration, right eye, with active choroidal neovascularization: Secondary | ICD-10-CM | POA: Diagnosis not present

## 2022-06-14 DIAGNOSIS — H353221 Exudative age-related macular degeneration, left eye, with active choroidal neovascularization: Secondary | ICD-10-CM | POA: Diagnosis not present

## 2022-06-14 DIAGNOSIS — H353132 Nonexudative age-related macular degeneration, bilateral, intermediate dry stage: Secondary | ICD-10-CM | POA: Diagnosis not present

## 2022-07-11 ENCOUNTER — Encounter: Payer: Self-pay | Admitting: Nurse Practitioner

## 2022-07-11 ENCOUNTER — Ambulatory Visit (INDEPENDENT_AMBULATORY_CARE_PROVIDER_SITE_OTHER): Payer: Medicare HMO | Admitting: Nurse Practitioner

## 2022-07-11 VITALS — BP 128/70 | HR 60 | Wt 179.8 lb

## 2022-07-11 DIAGNOSIS — M545 Low back pain, unspecified: Secondary | ICD-10-CM

## 2022-07-11 DIAGNOSIS — H353112 Nonexudative age-related macular degeneration, right eye, intermediate dry stage: Secondary | ICD-10-CM | POA: Diagnosis not present

## 2022-07-11 DIAGNOSIS — H353122 Nonexudative age-related macular degeneration, left eye, intermediate dry stage: Secondary | ICD-10-CM | POA: Diagnosis not present

## 2022-07-11 DIAGNOSIS — H353211 Exudative age-related macular degeneration, right eye, with active choroidal neovascularization: Secondary | ICD-10-CM | POA: Diagnosis not present

## 2022-07-11 DIAGNOSIS — H353221 Exudative age-related macular degeneration, left eye, with active choroidal neovascularization: Secondary | ICD-10-CM | POA: Diagnosis not present

## 2022-07-11 LAB — POCT URINALYSIS DIP (CLINITEK)
Blood, UA: NEGATIVE
Glucose, UA: NEGATIVE mg/dL
Ketones, POC UA: NEGATIVE mg/dL
Leukocytes, UA: NEGATIVE
Nitrite, UA: NEGATIVE
POC PROTEIN,UA: NEGATIVE
Spec Grav, UA: 1.025 (ref 1.010–1.025)
Urobilinogen, UA: 0.2 E.U./dL
pH, UA: 6 (ref 5.0–8.0)

## 2022-07-11 MED ORDER — DICLOFENAC SODIUM 75 MG PO TBEC: 75.0000 mg | DELAYED_RELEASE_TABLET | Freq: Two times a day (BID) | ORAL | 3 refills | Status: DC

## 2022-07-11 MED ORDER — TIZANIDINE HCL 4 MG PO TABS: 4.0000 mg | ORAL_TABLET | Freq: Four times a day (QID) | ORAL | 3 refills | Status: DC | PRN

## 2022-07-11 MED ORDER — KETOROLAC TROMETHAMINE 60 MG/2ML IM SOLN
60.0000 mg | Freq: Once | INTRAMUSCULAR | Status: AC
  Administered 2022-07-11: 60 mg via INTRAMUSCULAR

## 2022-07-11 MED ORDER — METHYLPREDNISOLONE SODIUM SUCC 125 MG IJ SOLR
62.5000 mg | Freq: Once | INTRAMUSCULAR | Status: AC
  Administered 2022-07-11: 62.5 mg via INTRAMUSCULAR

## 2022-07-11 NOTE — Patient Instructions (Addendum)
If you are not feeling better in a week, please call or send me a message and let me know.    Piriformis Syndrome Rehab Ask your health care provider which exercises are safe for you. Do exercises exactly as told by your health care provider and adjust them as directed. It is normal to feel mild stretching, pulling, tightness, or discomfort as you do these exercises. Stop right away if you feel sudden pain or your pain gets worse. Do not begin these exercises until told by your health care provider. Stretching and range-of-motion exercises These exercises warm up your muscles and joints and improve the movement and flexibility of your hip and pelvis. The exercises also help to relieve pain, numbness, and tingling. Nerve root  Sit on a firm surface that is high enough that you can swing your left / right foot freely. Place a folded towel under your left / right thigh. This is optional. Drop your head forward and round your back. While you keep your left / right foot relaxed, slowly straighten your left / right knee until you feel a slight pull behind your knee or calf. If your leg is fully extended and you still do not feel a pull, slowly tilt your foot and toes toward you. Hold this position for _____15_____ seconds. Slowly return your knee to its starting position. Hip rotation This is an exercise in which you lie on your back and stretch the muscles that rotate your hip (hip rotators) to stretch your buttocks. Lie on your back on a firm surface. Pull your left / right knee toward your same shoulder with your left / right hand until your knee is pointing toward the ceiling. Hold your left / right ankle with your other hand. Keeping your knee steady, gently pull your left / right ankle toward your other shoulder until you feel a stretch in your buttocks. Hold this position for ____15______ seconds. Repeat _____3_____ times. Complete this exercise _____2_____ times a day. Hip extensor This is  an exercise in which you lie on your back and pull your knee to your chest. Lie on your back on a firm surface. Both of your legs should be straight. Pull your left / right knee to your chest. Hold your leg in this position by holding on to the back of your thigh or the front of your knee. Hold this position for _____15_____ seconds. Slowly return to the starting position. Repeat _____3_____ times. Complete this exercise ____2______ times a day. Strengthening exercises These exercises build strength and endurance in your hip and thigh muscles. Endurance is the ability to use your muscles for a long time, even after they get tired. Straight leg raises, side-lying This exercise strengthens the muscles that rotate the leg at the hip and move it away from your body (hip abductors). Lie on your side with your left / right leg in the top position. Lie so your head, shoulder, knee, and hip line up. Bend your bottom knee to help you balance. Lift your top leg 4-6 inches (10-15 cm) while keeping your toes pointed straight ahead. Hold this position for ____15______ seconds. Slowly lower your leg to the starting position. Let your muscles relax completely after each repetition. Repeat _____3_____ times. Complete this exercise ____2______ times a day. Hip abduction and rotation This is sometimes called quadruped (on hands and knees) exercises. Get on your hands and knees on a firm, lightly padded surface. Your hands should be directly below your shoulders, and your knees  should be directly below your hips. Lift your left / right knee out to the side. Keep your knee bent. Do not twist your body. Hold this position for _____15_____ seconds. Slowly lower your leg. Repeat ___3_______ times. Complete this exercise ______2____ times a day. Straight leg raises, prone This exercise stretches the muscles that move the hips (hip extensors). Lie on your abdomen on a firm surface (prone position). Tense the  muscles in your buttocks and lift your left / right leg about 4 inches (10 cm). Keep your knee straight as you lift your leg. If you cannot lift your leg that high without arching your back, place a pillow under your hips. Hold this position for ___15_______ seconds. Slowly lower your leg to the starting position. Let your muscles relax completely after each repetition. Repeat ____3______ times. Complete this exercise ___2_______ times a day. This information is not intended to replace advice given to you by your health care provider. Make sure you discuss any questions you have with your health care provider. Document Revised: 07/14/2020 Document Reviewed: 07/14/2020 Elsevier Patient Education  2024 [employer].

## 2022-07-11 NOTE — Assessment & Plan Note (Signed)
Lumbar pain on the right side with radiation around to the groin and into the testicle on occasion. Tightness and spasms noted in the piriformis. Suspect inflammation to the lumbar spine has triggered these symptoms.  Plan: - Toradol and Solumedrol IM in the office today for pain and inflammation - Refill of tizanidine and diclofenac sent to pharmacy. - Gentle stretching exercises provided - Continue to ice and heat. Continue to use lidocaine patch - Follow-up with call if no improvement in 1 week.

## 2022-07-11 NOTE — Progress Notes (Signed)
  Tollie Eth, DNP, AGNP-c [facility] [address] [place], [place] [address] [phone]  Subjective:   Joseph Moses is a 77 y.o. male presents to day for evaluation of: Right sided LBP [place] 3 [place]. Shortly after he noticed the right sided back pain started. He reports a constant pain level of about 3, but a sharp sudden pain will come up intermittently that is about an 8/10. He tells me that this morning he woke up and his back was actually feeling better, but it wasn't long after he was up and moving around that it started to hurt again.   He reports right sided lumbar pain with radiation into the right buttock and around to the right groin. He reports some radiation into the right thigh on occasion and periods of sharp radiation into the right scrotum.   He has been using heat and ice, which is helpful. He does not feel ibuprofen has been helpful.   PMH, Medications, and Allergies reviewed and updated in chart as appropriate.   ROS negative except for what is listed in HPI. Objective:  BP 128/70   Pulse 60   Wt 179 lb 12.8 oz (81.6 kg)   BMI 25.43 kg/m  Physical Exam Vitals and nursing note reviewed.  Constitutional:      Appearance: Normal appearance.  HENT:     Head: Normocephalic.  Musculoskeletal:        General: Tenderness present.       Legs:     Comments: Area of pain travel.  Skin:    General: Skin is warm and dry.     Capillary Refill: Capillary refill takes less than 2 seconds.  Neurological:     General: No focal deficit present.     Mental Status: He is alert and oriented to person, place, and time.     Sensory: No sensory deficit.     Motor: No weakness.     Coordination: Coordination normal.     Gait: Gait abnormal.  Psychiatric:        Mood and Affect: Mood normal.           Assessment & Plan:   Problem List Items Addressed This Visit     Right lumbar pain -  Primary    Lumbar pain on the right side with radiation around to the groin and into the testicle on occasion. Tightness and spasms noted in the piriformis. Suspect inflammation to the lumbar spine has triggered these symptoms.  Plan: - Toradol and Solumedrol IM in the office today for pain and inflammation - Refill of tizanidine and diclofenac sent to pharmacy. - Gentle stretching exercises provided - Continue to ice and heat. Continue to use lidocaine patch - Follow-up with call if no improvement in 1 week.      Relevant Medications   tiZANidine (ZANAFLEX) 4 MG tablet   diclofenac (VOLTAREN) 75 MG EC tablet   Other Relevant Orders   POCT URINALYSIS DIP (CLINITEK) (Completed)      Tollie Eth, DNP, AGNP-c 07/11/2022  2:12 PM    History, Medications, Surgery, SDOH, and Family History reviewed and updated as appropriate.

## 2022-08-22 DIAGNOSIS — H353221 Exudative age-related macular degeneration, left eye, with active choroidal neovascularization: Secondary | ICD-10-CM | POA: Diagnosis not present

## 2022-08-22 DIAGNOSIS — H353132 Nonexudative age-related macular degeneration, bilateral, intermediate dry stage: Secondary | ICD-10-CM | POA: Diagnosis not present

## 2022-08-22 DIAGNOSIS — H353211 Exudative age-related macular degeneration, right eye, with active choroidal neovascularization: Secondary | ICD-10-CM | POA: Diagnosis not present

## 2022-08-23 DIAGNOSIS — L738 Other specified follicular disorders: Secondary | ICD-10-CM | POA: Diagnosis not present

## 2022-08-23 DIAGNOSIS — R21 Rash and other nonspecific skin eruption: Secondary | ICD-10-CM | POA: Diagnosis not present

## 2022-08-24 DIAGNOSIS — H353211 Exudative age-related macular degeneration, right eye, with active choroidal neovascularization: Secondary | ICD-10-CM | POA: Diagnosis not present

## 2022-08-24 DIAGNOSIS — H353132 Nonexudative age-related macular degeneration, bilateral, intermediate dry stage: Secondary | ICD-10-CM | POA: Diagnosis not present

## 2022-08-24 DIAGNOSIS — H353221 Exudative age-related macular degeneration, left eye, with active choroidal neovascularization: Secondary | ICD-10-CM | POA: Diagnosis not present

## 2022-08-24 DIAGNOSIS — G4733 Obstructive sleep apnea (adult) (pediatric): Secondary | ICD-10-CM | POA: Diagnosis not present

## 2022-09-13 DIAGNOSIS — L814 Other melanin hyperpigmentation: Secondary | ICD-10-CM | POA: Diagnosis not present

## 2022-09-13 DIAGNOSIS — D225 Melanocytic nevi of trunk: Secondary | ICD-10-CM | POA: Diagnosis not present

## 2022-09-13 DIAGNOSIS — L821 Other seborrheic keratosis: Secondary | ICD-10-CM | POA: Diagnosis not present

## 2022-09-13 DIAGNOSIS — H5203 Hypermetropia, bilateral: Secondary | ICD-10-CM | POA: Diagnosis not present

## 2022-09-13 DIAGNOSIS — H26491 Other secondary cataract, right eye: Secondary | ICD-10-CM | POA: Diagnosis not present

## 2022-09-24 DIAGNOSIS — G4733 Obstructive sleep apnea (adult) (pediatric): Secondary | ICD-10-CM | POA: Diagnosis not present

## 2022-09-27 ENCOUNTER — Encounter: Payer: Self-pay | Admitting: Nurse Practitioner

## 2022-09-27 ENCOUNTER — Telehealth: Payer: Self-pay | Admitting: Nurse Practitioner

## 2022-09-27 DIAGNOSIS — L739 Follicular disorder, unspecified: Secondary | ICD-10-CM

## 2022-09-27 MED ORDER — MUPIROCIN 2 % EX OINT: TOPICAL_OINTMENT | CUTANEOUS | 0 refills | Status: DC

## 2022-09-27 MED ORDER — DOXYCYCLINE HYCLATE 100 MG PO TABS: 100.0000 mg | ORAL_TABLET | Freq: Two times a day (BID) | ORAL | 0 refills | Status: DC

## 2022-09-27 NOTE — Telephone Encounter (Signed)
Pt stopped by the office states had to go to Urgent Care because we didn't have any openings & was diagnosed with folliculitis 08/23/22 & was given Mupirocin 2% 15 gms apply 2 times every day, & Cephalexim 500mg  take 1 tablet 4 times a day for 7 days (pt had copies of both RX)  he got better but has come back has 6-8 spots & would like a refill sent to [facility].

## 2022-10-03 DIAGNOSIS — H353122 Nonexudative age-related macular degeneration, left eye, intermediate dry stage: Secondary | ICD-10-CM | POA: Diagnosis not present

## 2022-10-03 DIAGNOSIS — H353211 Exudative age-related macular degeneration, right eye, with active choroidal neovascularization: Secondary | ICD-10-CM | POA: Diagnosis not present

## 2022-10-03 DIAGNOSIS — H353221 Exudative age-related macular degeneration, left eye, with active choroidal neovascularization: Secondary | ICD-10-CM | POA: Diagnosis not present

## 2022-10-03 DIAGNOSIS — H353112 Nonexudative age-related macular degeneration, right eye, intermediate dry stage: Secondary | ICD-10-CM | POA: Diagnosis not present

## 2022-10-05 DIAGNOSIS — H353221 Exudative age-related macular degeneration, left eye, with active choroidal neovascularization: Secondary | ICD-10-CM | POA: Diagnosis not present

## 2022-10-05 DIAGNOSIS — H353211 Exudative age-related macular degeneration, right eye, with active choroidal neovascularization: Secondary | ICD-10-CM | POA: Diagnosis not present

## 2022-10-05 DIAGNOSIS — H353132 Nonexudative age-related macular degeneration, bilateral, intermediate dry stage: Secondary | ICD-10-CM | POA: Diagnosis not present

## 2022-10-12 DIAGNOSIS — G4733 Obstructive sleep apnea (adult) (pediatric): Secondary | ICD-10-CM | POA: Diagnosis not present

## 2022-10-24 DIAGNOSIS — G4733 Obstructive sleep apnea (adult) (pediatric): Secondary | ICD-10-CM | POA: Diagnosis not present

## 2022-11-15 DIAGNOSIS — I739 Peripheral vascular disease, unspecified: Secondary | ICD-10-CM | POA: Diagnosis not present

## 2022-11-15 DIAGNOSIS — N400 Enlarged prostate without lower urinary tract symptoms: Secondary | ICD-10-CM | POA: Diagnosis not present

## 2022-11-15 DIAGNOSIS — N182 Chronic kidney disease, stage 2 (mild): Secondary | ICD-10-CM | POA: Diagnosis not present

## 2022-11-15 DIAGNOSIS — R32 Unspecified urinary incontinence: Secondary | ICD-10-CM | POA: Diagnosis not present

## 2022-11-15 DIAGNOSIS — Z823 Family history of stroke: Secondary | ICD-10-CM | POA: Diagnosis not present

## 2022-11-15 DIAGNOSIS — H353290 Exudative age-related macular degeneration, unspecified eye, stage unspecified: Secondary | ICD-10-CM | POA: Diagnosis not present

## 2022-11-15 DIAGNOSIS — L739 Follicular disorder, unspecified: Secondary | ICD-10-CM | POA: Diagnosis not present

## 2022-11-15 DIAGNOSIS — I129 Hypertensive chronic kidney disease with stage 1 through stage 4 chronic kidney disease, or unspecified chronic kidney disease: Secondary | ICD-10-CM | POA: Diagnosis not present

## 2022-11-15 DIAGNOSIS — H269 Unspecified cataract: Secondary | ICD-10-CM | POA: Diagnosis not present

## 2022-11-15 DIAGNOSIS — G4733 Obstructive sleep apnea (adult) (pediatric): Secondary | ICD-10-CM | POA: Diagnosis not present

## 2022-11-15 DIAGNOSIS — Z809 Family history of malignant neoplasm, unspecified: Secondary | ICD-10-CM | POA: Diagnosis not present

## 2022-11-17 DIAGNOSIS — H353211 Exudative age-related macular degeneration, right eye, with active choroidal neovascularization: Secondary | ICD-10-CM | POA: Diagnosis not present

## 2022-11-17 DIAGNOSIS — H353112 Nonexudative age-related macular degeneration, right eye, intermediate dry stage: Secondary | ICD-10-CM | POA: Diagnosis not present

## 2022-11-17 DIAGNOSIS — H353122 Nonexudative age-related macular degeneration, left eye, intermediate dry stage: Secondary | ICD-10-CM | POA: Diagnosis not present

## 2022-11-17 DIAGNOSIS — H353221 Exudative age-related macular degeneration, left eye, with active choroidal neovascularization: Secondary | ICD-10-CM | POA: Diagnosis not present

## 2022-11-22 DIAGNOSIS — G4733 Obstructive sleep apnea (adult) (pediatric): Secondary | ICD-10-CM | POA: Diagnosis not present

## 2022-12-06 ENCOUNTER — Encounter: Payer: Self-pay | Admitting: Medical

## 2022-12-06 ENCOUNTER — Ambulatory Visit (INDEPENDENT_AMBULATORY_CARE_PROVIDER_SITE_OTHER): Payer: Medicare HMO | Admitting: Medical

## 2022-12-06 VITALS — BP 110/60 | HR 59 | Ht 72.0 in | Wt 179.6 lb

## 2022-12-06 DIAGNOSIS — M25512 Pain in left shoulder: Secondary | ICD-10-CM

## 2022-12-06 NOTE — Patient Instructions (Addendum)
Pain likely due to some tendonitis and possible some underlying arthritis   Recommendations: Rest the left arm over the next 1-2 weeks Use arm sling to rest the arm or support on pillows Use alternating tylenol over the counter 325mg  or 500mg  up to twice daily, and alternate with Aleve over the counter, 2 tablets up to twice daily for pain and inflammation Avoid sleeping directly on the shoulder  Consider topical Aspercreme or capsaicin cream for relief Consider Glucosamine chondroitin supplement and /or fish oil supplement If not much improved in 1-2 weeks, consider xray or referral to sports medicine

## 2022-12-06 NOTE — Progress Notes (Signed)
Subjective:  Joseph Moses is a 77 y.o. male who presents for Chief Complaint  Patient presents with   Shoulder Pain    Left shoulder pain x 2-3 weeks. Has taken ibuprofen.      Here for left shoulder pain x 2-3 weeks.  Not improving.  No recent fall, no injury.  No history of chronic shoulder pain.  He does have a history of neck pain and prior neck surgery.  He has had little bit of neck pain on the left.  He does have generalized decreased range of motion in his neck chronically  His shoulder hurts at night.  He sleeps on his left side using the CPAP and that seemed to aggravate his pain even more  Is right handed.  Does yard work, Art therapist, and stays active.   He has been using ibuprofen 800 mg (4 tablets) twice daily over-the-counter  No other aggravating or relieving factors.    No other c/o.  Past Medical History:  Diagnosis Date   Acute MEE (middle ear effusion), right 04/18/2022   Diarrhea 01/12/2022   Impacted cerumen of left ear 04/18/2022   Past Surgical History:  Procedure Laterality Date   CERVICAL DISCECTOMY  2001 &2005   NASAL SEPTUM SURGERY  1968     The following portions of the patient's history were reviewed and updated as appropriate: allergies, current medications, past family history, past medical history, past social history, past surgical history and problem list.  ROS Otherwise as in subjective above    Objective: BP 110/60   Pulse (!) 59   Ht 6' (1.829 m)   Wt 179 lb 9.6 oz (81.5 kg)   BMI 24.36 kg/m   General appearance: alert, no distress, well developed, well nourished Neck range of motion with generalized decreased range of motion, anterior neck surgical scars, tender left posterior neck mildly, otherwise nontender, no midline tenderness, no deformity mass or lymphadenopathy MSK: Tender over the left bicep origin otherwise nontender shoulder to palpation, he seems to have pain in the biceps origin with range of motion but range of  motion of shoulder is full.  Otherwise are nontender with no other deformity or swelling.  No major pain with special test.  He has slight weakness in the left compared to the right with special test including rotator cuff test but no positive labral test. Arms neurovascularly intact    Assessment: Encounter Diagnosis  Name Primary?   Acute pain of left shoulder Yes     Plan: Discussed exam findings, symptoms, recommendations as below  Patient Instructions  Pain likely due to some tendonitis and possible some underlying arthritis   Recommendations: Rest the left arm over the next 1-2 weeks Use arm sling to rest the arm or support on pillows Use alternating tylenol over the counter 325mg  or 500mg  up to twice daily, and alternate with Aleve over the counter, 2 tablets up to twice daily for pain and inflammation Avoid sleeping directly on the shoulder  Consider topical Aspercreme or capsaicin cream for relief Consider Glucosamine chondroitin supplement and /or fish oil supplement If not much improved in 1-2 weeks, consider xray or referral to sports medicine      Necalli "Mardelle Matte" was seen today for shoulder pain.  Diagnoses and all orders for this visit:  Acute pain of left shoulder    Follow up: 1-2 weeks

## 2022-12-23 DIAGNOSIS — G4733 Obstructive sleep apnea (adult) (pediatric): Secondary | ICD-10-CM | POA: Diagnosis not present

## 2023-01-12 DIAGNOSIS — H353211 Exudative age-related macular degeneration, right eye, with active choroidal neovascularization: Secondary | ICD-10-CM | POA: Diagnosis not present

## 2023-01-12 DIAGNOSIS — H353122 Nonexudative age-related macular degeneration, left eye, intermediate dry stage: Secondary | ICD-10-CM | POA: Diagnosis not present

## 2023-01-12 DIAGNOSIS — H353112 Nonexudative age-related macular degeneration, right eye, intermediate dry stage: Secondary | ICD-10-CM | POA: Diagnosis not present

## 2023-01-12 DIAGNOSIS — H353221 Exudative age-related macular degeneration, left eye, with active choroidal neovascularization: Secondary | ICD-10-CM | POA: Diagnosis not present

## 2023-01-22 DIAGNOSIS — G4733 Obstructive sleep apnea (adult) (pediatric): Secondary | ICD-10-CM | POA: Diagnosis not present

## 2023-01-30 ENCOUNTER — Ambulatory Visit: Payer: Medicare HMO | Admitting: Nurse Practitioner

## 2023-01-30 ENCOUNTER — Encounter: Payer: Self-pay | Admitting: Nurse Practitioner

## 2023-01-30 VITALS — BP 122/72 | Ht 71.0 in | Wt 179.8 lb

## 2023-01-30 DIAGNOSIS — M1909 Primary osteoarthritis, other specified site: Secondary | ICD-10-CM

## 2023-01-30 DIAGNOSIS — G4733 Obstructive sleep apnea (adult) (pediatric): Secondary | ICD-10-CM | POA: Diagnosis not present

## 2023-01-30 DIAGNOSIS — Z0000 Encounter for general adult medical examination without abnormal findings: Secondary | ICD-10-CM

## 2023-01-30 DIAGNOSIS — H353221 Exudative age-related macular degeneration, left eye, with active choroidal neovascularization: Secondary | ICD-10-CM

## 2023-01-30 DIAGNOSIS — N400 Enlarged prostate without lower urinary tract symptoms: Secondary | ICD-10-CM

## 2023-01-30 DIAGNOSIS — M25519 Pain in unspecified shoulder: Secondary | ICD-10-CM | POA: Diagnosis not present

## 2023-01-30 DIAGNOSIS — R202 Paresthesia of skin: Secondary | ICD-10-CM

## 2023-01-30 NOTE — Progress Notes (Signed)
 01/30/2023   Vitals:  BP 122/72   Ht 5' 11 (1.803 m)   Wt 179 lb 12.8 oz (81.6 kg)   BMI 25.08 kg/m   Body mass index is 25.08 kg/m. Joseph Moses is a 78 y.o. male who presents for Subsequent Medicare Annual Wellness Exam  Care Team Members: Current Providers as of 01/30/2023 PCP: Oris Camie BRAVO, NP Care Team Provider: Porter Andrez [place], [place] Encounter Provider: Oris Camie BRAVO, NP, starting on Mon Jan 30, 2023 12:00 AM Referring Provider: Oris Camie BRAVO, NP, starting on Mon Jan 30, 2023 12:00 AM Attending Provider: Oris Camie BRAVO, NP, starting on Tue Jan 25, 2022  3:14 PM (Active)   Method of visit:  in person In the event virtual visit conducted, the patient consented to a virtual visit. Patient consented to have virtual visit and was identified by two identifiers.  Encounter participants: Patient: Joseph Moses - located [facility] Patient Visit Location: In Office Nurse/Provider: Camie CHARLENA Oris - located Virtual Visit Location Provider: Office/Clinic Others (if applicable): patient only  Currently using Eyelea in the eyes for macular degeneration.  History of Present Illness Jodie reports improvement of back pain with exercises within the last year. He also mentions a shoulder pain that he feels is likely arthritic in nature.   He also reported circulation issues in the toes, characterized by redness and occasional nocturnal pain that wakes him from sleep. He manages this discomfort by hanging his feet over the bed to redistribute blood to the extremities. He noted this issue as acute rather than chronic, with symptoms coming and going.  He also reported issues with his eyes, specifically a break-up of letters when texting on his phone, which he attributes to his retinal issues. He is currently on treatment for his retinal issues, with one medication for each eye. He expressed fear of potential blindness due to these issues.  Review of Systems:  Neuro: Denies difficulty remembering  daily tasks, people, or places.  Ear: Denies difficulty hearing or need to increase volume on television or telephone to hear Eye: Denies visual changes, difficulty reading normal print, or visual field deficits. Cardiac: Denies chest pain, palpitations, dizziness, shortness of breath, pain in lower extremities, or night time waking with shortness of breath. Lung: Denies shortness of breath, difficulty breathing, chronic cough, or dizziness.  GI: Denies changes in bowel habits, blood in stool, difficulty passing stool, decreased intake of food or drink, nausea, or vomiting.  GU: Denies changes in urinary habits, dark urine, malodorous urine, increased or decreased urination, or urinary incontinence.  MSK: Denies weakness in extremities, difficulty walking, difficulty grasping, or new MSK pain.  Skin: Denies changes to the skin, fragile skin, or increased bruising.  Constitution: Denies fatigue, weakness, or confusion.   Patient rating of health: better as this time last year  Clinical Intake:  BMI - recorded: 25.08 Nutritional Status: BMI 25 -29 Overweight Nutritional Risks: None Diabetes: No  Activities of Daily Living: Independent Ambulation: Independent Medication Administration: Independent Home Management: Independent  Barriers to Care Management & Learning: None  Do you feel unsafe in your current relationship?: No Do you feel physically threatened by others?: No Anyone hurting you at home, work, or school?: No Unable to ask?: No Information provided on Community resources: No  How often do you need to have someone help you when you read instructions, pamphlets, or other written materials from your doctor or pharmacy?: 1 - Never  Interpreter Needed?: No  Information entered by ::  S. Ceejay Kegley     01/30/2023    1:26 PM 03/01/2019    8:47 AM 02/01/2019    1:22 PM  Advanced Directives  Does Patient Have a Medical Advance Directive? Yes Yes Yes  Type of Special Educational Needs Teacher of [place];Living will Healthcare Power of [place];Living will Healthcare Power of Attorney  Does patient want to make changes to medical advance directive? Yes (ED - Information included in AVS) No - Patient declined No - Patient declined  Copy of Healthcare Power of Attorney in Chart? No - copy requested      Social Determinants of Health SDOH Screenings   Food Insecurity: No Food Insecurity (10/01/2021)  Housing: Low Risk  (10/01/2021)  Transportation Needs: No Transportation Needs (10/01/2021)  Utilities: Not At Risk (10/01/2021)  Alcohol Screen: Low Risk  (10/01/2021)  Depression (PHQ2-9): Low Risk  (01/30/2023)  Financial Resource Strain: Low Risk  (10/01/2021)  Physical Activity: Insufficiently Active (10/01/2021)  Social Connections: Moderately Integrated (10/01/2021)  Stress: No Stress Concern Present (10/01/2021)  Tobacco Use: Low Risk  (01/30/2023)     Functional Status Survey: Is the patient deaf or have difficulty hearing?: Yes (little bit) Does the patient have difficulty seeing, even when wearing glasses/contacts?: Yes (AMD both eyes) Does the patient have difficulty concentrating, remembering, or making decisions?: No Does the patient have difficulty walking or climbing stairs?: No Does the patient have difficulty dressing or bathing?: No Does the patient have difficulty doing errands alone such as visiting a doctor's office or shopping?: No   Annual Goal:  Goals      Patient Stated     Maintain weight and mental acuity         Fall Risk    01/30/2023    1:24 PM 01/25/2022    2:27 PM 10/01/2021   11:50 AM 08/03/2021    2:15 PM  Fall Risk   Falls in the past year? 0 0 0 0  Number falls in past yr: 0 0 0 0  Injury with Fall? 0 0 0 0  Risk for fall due to : No Fall Risks No Fall Risks No Fall Risks No Fall Risks  Follow up Falls evaluation completed Falls evaluation completed Falls evaluation completed Falls evaluation completed;Education provided    Medicare  Risk     Cognitive Function Normal: Yes Exam Completed: MiniCog        10/01/2021   11:52 AM  6CIT Screen  What Year? 0 points  What month? 0 points  What time? 0 points  Count back from 20 0 points  Months in reverse 0 points  Repeat phrase 2 points  Total Score 2 points    Mini-Cog - 01/30/23 1325     Normal clock drawing test? yes    How many words correct? 3             Depression Screening    01/30/2023    1:25 PM 01/25/2022    2:27 PM 10/01/2021   11:49 AM 08/03/2021    2:15 PM  Depression screen PHQ 2/9  Decreased Interest 0 0 0 0  Down, Depressed, Hopeless 0 0 0 0  PHQ - 2 Score 0 0 0 0     Activities of Daily Living    01/30/2023    1:27 PM  In your present state of health, do you have any difficulty performing the following activities:  Hearing? 1  Comment little bit  Vision? 1  Comment AMD both eyes  Difficulty  concentrating or making decisions? 0  Walking or climbing stairs? 0  Dressing or bathing? 0  Doing errands, shopping? 0  Preparing Food and eating ? N  Using the Toilet? N  In the past six months, have you accidently leaked urine? Y  Comment sometimes  Do you have problems with loss of bowel control? N  Managing your Medications? N  Managing your Finances? N  Housekeeping or managing your Housekeeping? N    Tobacco Social History   Tobacco Use  Smoking Status Never  Smokeless Tobacco Never     Counseling given: Not Answered   Hospitalizations in the Past Year: none  ED Visits in the Past Year: No  Surgeries in the Past Year: No   History    Medication List Current Meds  Medication Sig   Magnesium 250 MG TABS Take 1 tablet by mouth daily.   mupirocin  ointment (BACTROBAN ) 2 % Apply to wounds twice a day for 7 days.   tiZANidine  (ZANAFLEX ) 4 MG tablet Take 1 tablet (4 mg total) by mouth every 6 (six) hours as needed for muscle spasms.   vitamin B-12 (CYANOCOBALAMIN) 500 MCG tablet Take 500 mcg by mouth daily.    [DISCONTINUED] fluticasone  (FLONASE ) 50 MCG/ACT nasal spray Place 2 sprays into both nostrils daily.     Immunizations Immunization History  Administered Date(s) Administered   Influenza Split 11/17/2010, 11/28/2013, 10/23/2014, 10/21/2015, 10/07/2016, 10/25/2019   Influenza, High Dose Seasonal PF 10/05/2021, 09/20/2022   Moderna Sars-Covid-2 Vaccination 02/25/2019, 03/25/2019, 10/25/2019   Pneumococcal Conjugate-13 03/14/2013   Pneumococcal Polysaccharide-23 06/30/2010   Respiratory Syncytial Virus Vaccine,Recomb Aduvanted(Arexvy) 10/05/2021   Td 03/30/2016   Tdap 03/01/2006   Zoster, Live 06/30/2010, 01/25/2017, 04/26/2017     Screening Tests Health Maintenance  Topic Date Due   COVID-19 Vaccine (4 - 2024-25 season) 02/15/2023 (Originally 09/25/2022)   Zoster Vaccines- Shingrix (1 of 2) 04/30/2023 (Originally 09/26/1994)   Medicare Annual Wellness (AWV)  01/30/2024   DTaP/Tdap/Td (3 - Td or Tdap) 03/31/2026   Pneumonia Vaccine 110+ Years old  Completed   INFLUENZA VACCINE  Completed   HPV VACCINES  Aged Out   Colonoscopy  Discontinued   Hepatitis C Screening  Discontinued    Health Maintenance Screenings  The patient has no Health Maintenance topics of status Overdue, Due On, or Due Soon   RSV Vaccine: is up to date  Past Medical History:  Diagnosis Date   Acute MEE (middle ear effusion), right 04/18/2022   Diarrhea 01/12/2022   Impacted cerumen of left ear 04/18/2022   Past Surgical History:  Procedure Laterality Date   CERVICAL DISCECTOMY  2001 &2005   NASAL SEPTUM SURGERY  1968   No family history on file. Social History   Socioeconomic History   Marital status: Married    Spouse name: Not on file   Number of children: Not on file   Years of education: Not on file   Highest education level: Not on file  Occupational History   Not on file  Tobacco Use   Smoking status: Never   Smokeless tobacco: Never  Vaping Use   Vaping status: Never Used  Substance and  Sexual Activity   Alcohol use: Yes    Alcohol/week: 1.0 standard drink of alcohol    Types: 1 Cans of beer per week   Drug use: No   Sexual activity: Not on file  Other Topics Concern   Not on file  Social History Narrative   Not on file  Social Drivers of Corporate Investment Banker Strain: Low Risk  (10/01/2021)   Overall Financial Resource Strain (CARDIA)    Difficulty of Paying Living Expenses: Not hard at all  Food Insecurity: No Food Insecurity (10/01/2021)   Hunger Vital Sign    Worried About Running Out of Food in the Last Year: Never true    Ran Out of Food in the Last Year: Never true  Transportation Needs: No Transportation Needs (10/01/2021)   PRAPARE - Administrator, Civil Service (Medical): No    Lack of Transportation (Non-Medical): No  Physical Activity: Insufficiently Active (10/01/2021)   Exercise Vital Sign    Days of Exercise per Week: 4 days    Minutes of Exercise per Session: 30 min  Stress: No Stress Concern Present (10/01/2021)   [employer] of Occupational Health - Occupational Stress Questionnaire    Feeling of Stress : Not at all  Social Connections: Moderately Integrated (10/01/2021)   Social Connection and Isolation Panel [NHANES]    Frequency of Communication with Friends and Family: Three times a week    Frequency of Social Gatherings with Friends and Family: Once a week    Attends Religious Services: Never    Database Administrator or Organizations: Yes    Attends Engineer, Structural: More than 4 times per year    Marital Status: Married    Outpatient Encounter Medications as of 01/30/2023  Medication Sig   Magnesium 250 MG TABS Take 1 tablet by mouth daily.   mupirocin  ointment (BACTROBAN ) 2 % Apply to wounds twice a day for 7 days.   tiZANidine  (ZANAFLEX ) 4 MG tablet Take 1 tablet (4 mg total) by mouth every 6 (six) hours as needed for muscle spasms.   vitamin B-12 (CYANOCOBALAMIN) 500 MCG tablet Take 500 mcg by mouth  daily.   [DISCONTINUED] fluticasone  (FLONASE ) 50 MCG/ACT nasal spray Place 2 sprays into both nostrils daily.   No facility-administered encounter medications on file as of 01/30/2023.    Physical Exam: Yes-completed Physical Exam Vitals and nursing note reviewed.  Constitutional:      General: He is not in acute distress.    Appearance: Normal appearance.  HENT:     Head: Normocephalic.     Right Ear: Tympanic membrane normal.     Left Ear: Tympanic membrane normal.     Nose: Nose normal.     Mouth/Throat:     Mouth: Mucous membranes are moist.     Pharynx: Oropharynx is clear.  Eyes:     Conjunctiva/sclera: Conjunctivae normal.     Pupils: Pupils are equal, round, and reactive to light.  Neck:     Vascular: No carotid bruit.  Cardiovascular:     Rate and Rhythm: Normal rate and regular rhythm.     Pulses: Normal pulses.     Heart sounds: Normal heart sounds.  Pulmonary:     Effort: Pulmonary effort is normal.     Breath sounds: Normal breath sounds.  Abdominal:     General: Bowel sounds are normal. There is no distension.     Palpations: Abdomen is soft.     Tenderness: There is no abdominal tenderness. There is no right CVA tenderness, left CVA tenderness or guarding.  Musculoskeletal:        General: Signs of injury present. No swelling. Normal range of motion.     Cervical back: Normal range of motion and neck supple. No tenderness.     Right lower leg:  No edema.     Left lower leg: No edema.     Comments: Left shoulder   Lymphadenopathy:     Cervical: No cervical adenopathy.  Skin:    General: Skin is warm and dry.     Capillary Refill: Capillary refill takes less than 2 seconds.  Neurological:     General: No focal deficit present.     Mental Status: He is alert and oriented to person, place, and time.     Sensory: No sensory deficit.     Motor: No weakness.     Coordination: Coordination normal.  Psychiatric:        Mood and Affect: Mood normal.         Behavior: Behavior normal.     PLAN  Exercise Activities and Dietary Recommendations - choose a type of activity I enjoy such as biking, gardening, team sports, walking - use fitness equipment Regular diet  Fall Prevention - always use handrails on the stairs - always wear shoes or slippers with non-slip sole - get at least 10 minutes of activity every day  Orders Placed This Encounter  Procedures   CBC with Differential/Platelet   CMP14+EGFR   Hemoglobin A1c   Lipid panel   PSA Total (Reflex To Free)   %fPSA Reflex     I have personally reviewed and noted the following in the patient's chart:   Medical and social history Use of alcohol, tobacco or illicit drugs  Current medications and supplements Functional ability and status Nutritional status Physical activity Advanced directives List of other physicians Hospitalizations, surgeries, and ER visits in previous 12 months Vitals Screenings to include cognitive, depression, and falls Referrals and appointments  In addition, I have reviewed and discussed with patient certain preventive protocols, quality metrics, and best practice recommendations. A written personalized care plan for preventive services as well as general preventive health recommendations were provided to patient.   Camie CHARLENA Doing, NP  01/30/2023

## 2023-01-30 NOTE — Patient Instructions (Addendum)
 If you decide that you would like to try something for your feet, feel free to let me know and we can try gabapentin at night to see if this helps.   I do recommend trying 81mg  of aspirin to see if this helps. If not, you can certainly stop it.   Rotator Cuff Tear Rehab  It is normal to feel mild stretching, pulling, tightness, or discomfort as you do these exercises. Stop right away if you feel sudden pain or your pain gets worse. Do not begin these exercises until told by your health care provider. Stretching and range-of-motion exercises These exercises warm up your muscles and joints and improve the movement and flexibility of your shoulder. These exercises also help to relieve pain. Shoulder pendulum In this exercise, you let the injured arm dangle toward the floor and then swing it like a clock pendulum. Stand near a table or counter that you can hold onto for balance. Bend forward at the waist and let your left / right arm hang straight down. Use your other arm to support you and help you stay balanced. Relax your left / right arm and shoulder muscles, and move your hips and your trunk so your left / right arm swings freely. Your arm should swing because of the motion of your body, not because you are using your arm or shoulder muscles. Keep moving your hips and trunk so your arm swings in the following directions, as told by your health care provider: Side to side. Forward and backward. In clockwise and counterclockwise circles. Slowly return to the starting position. Repeat ____3______ times, or for ____15______ seconds per direction. Complete this exercise ____2______ times a day. Shoulder flexion, seated This exercise is sometimes called table slides. In this exercise, you raise your arm in front of your body until you feel a stretch in your injured shoulder. Sit in a stable chair so your left / right forearm can rest on a flat surface. Your elbow should rest at a height that keeps  your upper arm next to your body. Keeping your left / right shoulder relaxed, lean forward at the waist and let your hand slide forward (flexion). Stop when you feel a stretch in your shoulder, or when you reach the angle that is recommended by your health care provider. Slowly return to the starting position. Repeat ____3______ times, or for ____15______ seconds per direction. Complete this exercise ____2______ times a day. Shoulder flexion, standing In this exercise, you raise your arm in front of your body (flexion) until you feel a stretch in your injured shoulder. Stand and hold a broomstick, a cane, or a similar object. Place your hands a little more than shoulder-width apart on the object. Your left / right hand should be palm-up, and your other hand should be palm-down. Keep your elbow straight and your shoulder muscles relaxed. Push the stick up with your healthy arm to raise your left / right arm in front of your body, and then over your head until you feel a stretch in your shoulder. Avoid shrugging your shoulder while you raise your arm. Keep your shoulder blade tucked down toward the middle of your back. Keep your left / right shoulder muscles relaxed. Hold for ___15_______ seconds. Slowly return to the starting position. Repeat ____3______ times. Complete this exercise ____2______ times a day. Shoulder abduction, active-assisted You will need a stick, broom handle, or similar object to help you (assist) in doing this exercise. Lie on your back. This is the  supine position. Hold a broomstick, a cane, or a similar object. Place your hands a little more than shoulder-width apart on the object. Your left / right hand should be palm-up, and your other hand should be palm-down. Keeping your shoulder relaxed, push the stick to raise your left / right arm out to your side (abduction) and then over your head. Use your other hand to help move the stick. Stop when you feel a stretch in your  shoulder, or when you reach the angle that is recommended by your health care provider. Avoid shrugging your shoulder while you raise your arm. Keep your shoulder blade tucked down toward the middle of your back. Hold for ____15______ seconds. Slowly return to the starting position. Repeat ____3______ times.. Complete this exercise ____2______ times a day. Shoulder flexion, active-assisted  Lie on your back. You may bend your knees for comfort. Hold a broomstick, a cane, or a similar object so that your hands are about shoulder-width apart. Your palms should face toward your feet. Raise your left / right arm over your head, then behind your head toward the floor (flexion). Use your other hand to help you do this (active-assisted). Stop when you feel a gentle stretch in your shoulder, or when you reach the angle that is recommended by your health care provider. Hold for ___15_______ seconds. Use the stick and your other arm to help you return your left / right arm to the starting position. Repeat ____3______ times. Complete this exercise ____2______ times a day. External rotation  Sit in a stable chair without armrests, or stand up. Tuck a soft object, such as a folded towel or a small ball, under your left / right upper arm. Hold a broomstick, a cane, or a similar object with your palms face-down, toward the floor. Bend your elbows to a 90-degree angle (right angle), and keep your hands about shoulder-width apart. Straighten your healthy arm and push the stick across your body, toward your left / right side. Keep your left / right arm bent. This will rotate your left / right forearm away from your body (external rotation). Hold for ____15______ seconds. Slowly return to the starting position. Repeat ____3______ times. Complete this exercise ____2______ times a day. Strengthening exercises These exercises build strength and endurance in your shoulder. Endurance is the ability to use your  muscles for a long time, even after they get tired. Do not start doing these exercises until your health care provider approves. Shoulder flexion, isometric Stand or sit in a doorway, facing the door frame. Keep your left / right arm straight and make a gentle fist with your hand. Place your fist against the door frame. Only your fist should be touching the frame. Keep your upper arm at your side. Gently press your fist against the door frame, as if you are trying to raise your arm above your head (isometric shoulder flexion). Avoid shrugging your shoulder while you press your hand into the door frame. Keep your shoulder blade tucked down toward the middle of your back. Hold for ____15______ seconds. Slowly release the tension, and relax your muscles completely before you repeat the exercise. Repeat ____3______ times. Complete this exercise ____2______ times a day. Shoulder abduction, isometric  Stand or sit in a doorway. Your left / right arm should be closest to the door frame. Keep your left / right arm straight, and place the back of your hand against the door frame. Only your hand should be touching the frame. Keep the  rest of your arm close to your side. Gently press the back of your hand against the door frame, as if you are trying to raise your arm out to the side (isometric shoulder abduction). Avoid shrugging your shoulder while you press your hand into the door frame. Keep your shoulder blade tucked down toward the middle of your back. Hold for ____15______ seconds. Slowly release the tension, and relax your muscles completely before you repeat the exercise. Repeat ____3______ times. Complete this exercise ____2______ times a day. Internal rotation, isometric This is an exercise in which you press your palm against a door frame without moving your shoulder joint (isometric). Stand or sit in a doorway, facing the door frame. Bend your left / right elbow, and place the palm of your hand  against the door frame. Only your palm should be touching the frame. Keep your upper arm at your side. Gently press your hand against the door frame, as if you are trying to push your arm toward your abdomen (internal rotation). Gradually increase the pressure until you are pressing as hard as you can. Stop increasing the pressure if you feel shoulder pain. Avoid shrugging your shoulder while you press your hand into the door frame. Keep your shoulder blade tucked down toward the middle of your back. Hold for ____15______ seconds. Slowly release the tension, and relax your muscles completely before you repeat the exercise. Repeat ____3______ times.. Complete this exercise ____2______ times a day. External rotation, isometric This is an exercise in which you press the back of your wrist against a door frame without moving your shoulder joint (isometric). Stand or sit in a doorway, facing the door frame. Bend your left / right elbow and place the back of your wrist against the door frame. Only the back of your wrist should be touching the frame. Keep your upper arm at your side. Gently press your wrist against the door frame, as if you are trying to push your arm away from your abdomen (external rotation). Gradually increase the pressure until you are pressing as hard as you can. Stop increasing the pressure if you feel pain. Avoid shrugging your shoulder while you press your wrist into the door frame. Keep your shoulder blade tucked down toward the middle of your back. Hold for _____15_____ seconds. Slowly release the tension, and relax your muscles completely before you repeat the exercise. Repeat ____3______ times. Complete this exercise ____2______ times a day. This information is not intended to replace advice given to you by your health care provider. Make sure you discuss any questions you have with your health care provider. Document Revised: 07/11/2019 Document Reviewed: 07/11/2019 [employer]vier  Patient Education  2024 [employer].

## 2023-01-31 LAB — LIPID PANEL
Chol/HDL Ratio: 2.7 {ratio} (ref 0.0–5.0)
Cholesterol, Total: 192 mg/dL (ref 100–199)
HDL: 70 mg/dL (ref >39)
LDL Chol Calc (NIH): 111 mg/dL — ABNORMAL HIGH (ref 0–99)
Triglycerides: 61 mg/dL (ref 0–149)
VLDL Cholesterol Cal: 11 mg/dL (ref 5–40)

## 2023-01-31 LAB — CBC WITH DIFFERENTIAL/PLATELET
Basophils Absolute: 0.1 10*3/uL (ref 0.0–0.2)
Basos: 1 %
EOS (ABSOLUTE): 0.2 10*3/uL (ref 0.0–0.4)
Eos: 3 %
Hematocrit: 41.5 % (ref 37.5–51.0)
Hemoglobin: 13.3 g/dL (ref 13.0–17.7)
Immature Grans (Abs): 0 10*3/uL (ref 0.0–0.1)
Immature Granulocytes: 0 %
Lymphocytes Absolute: 1.6 10*3/uL (ref 0.7–3.1)
Lymphs: 30 %
MCH: 28.9 pg (ref 26.6–33.0)
MCHC: 32 g/dL (ref 31.5–35.7)
MCV: 90 fL (ref 79–97)
Monocytes Absolute: 0.6 10*3/uL (ref 0.1–0.9)
Monocytes: 11 %
Neutrophils Absolute: 2.9 10*3/uL (ref 1.4–7.0)
Neutrophils: 55 %
Platelets: 363 10*3/uL (ref 150–450)
RBC: 4.6 x10E6/uL (ref 4.14–5.80)
RDW: 13.3 % (ref 11.6–15.4)
WBC: 5.3 10*3/uL (ref 3.4–10.8)

## 2023-01-31 LAB — CMP14+EGFR
ALT: 16 [IU]/L (ref 0–44)
AST: 23 [IU]/L (ref 0–40)
Albumin: 4.4 g/dL (ref 3.8–4.8)
Alkaline Phosphatase: 98 [IU]/L (ref 44–121)
BUN/Creatinine Ratio: 18 (ref 10–24)
BUN: 19 mg/dL (ref 8–27)
Bilirubin Total: 0.5 mg/dL (ref 0.0–1.2)
CO2: 25 mmol/L (ref 20–29)
Calcium: 10.1 mg/dL (ref 8.6–10.2)
Chloride: 102 mmol/L (ref 96–106)
Creatinine, Ser: 1.08 mg/dL (ref 0.76–1.27)
Globulin, Total: 2.7 g/dL (ref 1.5–4.5)
Glucose: 82 mg/dL (ref 70–99)
Potassium: 5.5 mmol/L — ABNORMAL HIGH (ref 3.5–5.2)
Sodium: 139 mmol/L (ref 134–144)
Total Protein: 7.1 g/dL (ref 6.0–8.5)
eGFR: 70 mL/min/{1.73_m2} (ref >59)

## 2023-01-31 LAB — FPSA% REFLEX
% FREE PSA: 25.7 %
PSA, FREE: 1.44 ng/mL

## 2023-01-31 LAB — PSA TOTAL (REFLEX TO FREE): Prostate Specific Ag, Serum: 5.6 ng/mL — ABNORMAL HIGH (ref 0.0–4.0)

## 2023-01-31 LAB — HEMOGLOBIN A1C
Est. average glucose Bld gHb Est-mCnc: 114 mg/dL
Hgb A1c MFr Bld: 5.6 % (ref 4.8–5.6)

## 2023-02-03 DIAGNOSIS — M25519 Pain in unspecified shoulder: Secondary | ICD-10-CM | POA: Insufficient documentation

## 2023-02-03 DIAGNOSIS — R202 Paresthesia of skin: Secondary | ICD-10-CM | POA: Insufficient documentation

## 2023-02-03 NOTE — Assessment & Plan Note (Signed)
 Close monitoring and management with eye specialist. No alarm symptoms present.  -Monitor for changes and notify specialist as directed

## 2023-02-03 NOTE — Assessment & Plan Note (Signed)
 Monitor labs today. No concerns present

## 2023-02-03 NOTE — Assessment & Plan Note (Signed)
 Routine annual physical examination. No acute complaints. General health is well-maintained. Reports maintaining weight and mental acuity as goals. No hospitalizations or emergency room visits in the past year. Health rated as better or at least as good as last year. - Schedule next annual physical examination for January 2026

## 2023-02-03 NOTE — Assessment & Plan Note (Signed)
 Shoulder pain consistent with degenerative changes and possible muscle injury. Beneficial exercises include pendulum swings, chest stretches, and wall walking. Avoids exercises that cause pain. Discussed that the ability to perform these exercises suggests small tears. - Continue current exercises including pendulum swings, chest stretches, and wall walking - Avoid exercises that cause pain - Consider physical therapy if symptoms worsen

## 2023-02-03 NOTE — Assessment & Plan Note (Signed)
>>  ASSESSMENT AND PLAN FOR EXUDATIVE AGE-RELATED MACULAR DEGENERATION OF LEFT EYE WITH ACTIVE CHOROIDAL NEOVASCULARIZATION (HCC) WRITTEN ON 02/03/2023  8:32 PM BY Aiana Nordquist E, NP  Close monitoring and management with eye specialist. No alarm symptoms present.  -Monitor for changes and notify specialist as directed

## 2023-02-03 NOTE — Assessment & Plan Note (Signed)
 Chronic with nightly CPAP use. No concerns.  -Continue CPAP

## 2023-02-03 NOTE — Assessment & Plan Note (Signed)
 Arthritis in hands causing discomfort but not limiting activities. Uses Tylenol and ibuprofen as needed. Discussed Voltaren gel for localized pain relief as an alternative to oral medications. - Consider using Voltaren gel for localized pain relief

## 2023-02-03 NOTE — Assessment & Plan Note (Signed)
 Intermittent burning and redness in toes, particularly at night, suggestive of peripheral neuropathy or circulatory issues. Also consider reynauds. Family history of similar conditions. Discussed trial of baby aspirin daily for two weeks to assess symptom improvement. - Try baby aspirin daily for two weeks to see if symptoms improve - Discontinue if no improvement after two weeks - Keep feet warm

## 2023-02-23 ENCOUNTER — Other Ambulatory Visit: Payer: Medicare HMO

## 2023-02-23 DIAGNOSIS — E875 Hyperkalemia: Secondary | ICD-10-CM | POA: Diagnosis not present

## 2023-02-24 LAB — CMP14+EGFR
ALT: 15 [IU]/L (ref 0–44)
AST: 21 [IU]/L (ref 0–40)
Albumin: 4.3 g/dL (ref 3.8–4.8)
Alkaline Phosphatase: 77 [IU]/L (ref 44–121)
BUN/Creatinine Ratio: 17 (ref 10–24)
BUN: 19 mg/dL (ref 8–27)
Bilirubin Total: 0.7 mg/dL (ref 0.0–1.2)
CO2: 25 mmol/L (ref 20–29)
Calcium: 9.8 mg/dL (ref 8.6–10.2)
Chloride: 105 mmol/L (ref 96–106)
Creatinine, Ser: 1.15 mg/dL (ref 0.76–1.27)
Globulin, Total: 2.4 g/dL (ref 1.5–4.5)
Glucose: 96 mg/dL (ref 70–99)
Potassium: 5.2 mmol/L (ref 3.5–5.2)
Sodium: 143 mmol/L (ref 134–144)
Total Protein: 6.7 g/dL (ref 6.0–8.5)
eGFR: 65 mL/min/{1.73_m2} (ref >59)

## 2023-02-28 ENCOUNTER — Encounter: Payer: Self-pay | Admitting: Nurse Practitioner

## 2023-03-30 DIAGNOSIS — H353122 Nonexudative age-related macular degeneration, left eye, intermediate dry stage: Secondary | ICD-10-CM | POA: Diagnosis not present

## 2023-03-30 DIAGNOSIS — H353211 Exudative age-related macular degeneration, right eye, with active choroidal neovascularization: Secondary | ICD-10-CM | POA: Diagnosis not present

## 2023-03-30 DIAGNOSIS — H353112 Nonexudative age-related macular degeneration, right eye, intermediate dry stage: Secondary | ICD-10-CM | POA: Diagnosis not present

## 2023-03-30 DIAGNOSIS — H353221 Exudative age-related macular degeneration, left eye, with active choroidal neovascularization: Secondary | ICD-10-CM | POA: Diagnosis not present

## 2023-03-31 ENCOUNTER — Other Ambulatory Visit: Payer: Self-pay | Admitting: Nurse Practitioner

## 2023-03-31 DIAGNOSIS — M545 Low back pain, unspecified: Secondary | ICD-10-CM

## 2023-03-31 NOTE — Telephone Encounter (Signed)
 Last apt 01/30/23.

## 2023-04-03 NOTE — Telephone Encounter (Signed)
 Pt is requesting a refill on tizanidine sent to  [facility] [id number] - [place], [place] - [address]

## 2023-04-03 NOTE — Telephone Encounter (Signed)
 Patient requesting refill.

## 2023-04-06 DIAGNOSIS — H353211 Exudative age-related macular degeneration, right eye, with active choroidal neovascularization: Secondary | ICD-10-CM | POA: Diagnosis not present

## 2023-04-06 DIAGNOSIS — H353221 Exudative age-related macular degeneration, left eye, with active choroidal neovascularization: Secondary | ICD-10-CM | POA: Diagnosis not present

## 2023-04-06 DIAGNOSIS — H353112 Nonexudative age-related macular degeneration, right eye, intermediate dry stage: Secondary | ICD-10-CM | POA: Diagnosis not present

## 2023-04-06 DIAGNOSIS — H353122 Nonexudative age-related macular degeneration, left eye, intermediate dry stage: Secondary | ICD-10-CM | POA: Diagnosis not present

## 2023-05-31 DIAGNOSIS — H353211 Exudative age-related macular degeneration, right eye, with active choroidal neovascularization: Secondary | ICD-10-CM | POA: Diagnosis not present

## 2023-05-31 DIAGNOSIS — H353122 Nonexudative age-related macular degeneration, left eye, intermediate dry stage: Secondary | ICD-10-CM | POA: Diagnosis not present

## 2023-05-31 DIAGNOSIS — H353112 Nonexudative age-related macular degeneration, right eye, intermediate dry stage: Secondary | ICD-10-CM | POA: Diagnosis not present

## 2023-05-31 DIAGNOSIS — H353221 Exudative age-related macular degeneration, left eye, with active choroidal neovascularization: Secondary | ICD-10-CM | POA: Diagnosis not present

## 2023-07-26 DIAGNOSIS — H353221 Exudative age-related macular degeneration, left eye, with active choroidal neovascularization: Secondary | ICD-10-CM | POA: Diagnosis not present

## 2023-07-26 DIAGNOSIS — H353122 Nonexudative age-related macular degeneration, left eye, intermediate dry stage: Secondary | ICD-10-CM | POA: Diagnosis not present

## 2023-07-26 DIAGNOSIS — H353112 Nonexudative age-related macular degeneration, right eye, intermediate dry stage: Secondary | ICD-10-CM | POA: Diagnosis not present

## 2023-07-26 DIAGNOSIS — H353211 Exudative age-related macular degeneration, right eye, with active choroidal neovascularization: Secondary | ICD-10-CM | POA: Diagnosis not present

## 2023-09-19 DIAGNOSIS — D225 Melanocytic nevi of trunk: Secondary | ICD-10-CM | POA: Diagnosis not present

## 2023-09-19 DIAGNOSIS — L821 Other seborrheic keratosis: Secondary | ICD-10-CM | POA: Diagnosis not present

## 2023-09-19 DIAGNOSIS — L814 Other melanin hyperpigmentation: Secondary | ICD-10-CM | POA: Diagnosis not present

## 2023-09-20 DIAGNOSIS — H353122 Nonexudative age-related macular degeneration, left eye, intermediate dry stage: Secondary | ICD-10-CM | POA: Diagnosis not present

## 2023-09-20 DIAGNOSIS — H353221 Exudative age-related macular degeneration, left eye, with active choroidal neovascularization: Secondary | ICD-10-CM | POA: Diagnosis not present

## 2023-09-20 DIAGNOSIS — H353112 Nonexudative age-related macular degeneration, right eye, intermediate dry stage: Secondary | ICD-10-CM | POA: Diagnosis not present

## 2023-09-20 DIAGNOSIS — H353211 Exudative age-related macular degeneration, right eye, with active choroidal neovascularization: Secondary | ICD-10-CM | POA: Diagnosis not present

## 2023-09-22 DIAGNOSIS — H524 Presbyopia: Secondary | ICD-10-CM | POA: Diagnosis not present

## 2023-09-22 DIAGNOSIS — H26491 Other secondary cataract, right eye: Secondary | ICD-10-CM | POA: Diagnosis not present

## 2023-09-28 DIAGNOSIS — G4733 Obstructive sleep apnea (adult) (pediatric): Secondary | ICD-10-CM | POA: Diagnosis not present

## 2023-10-17 DIAGNOSIS — G4733 Obstructive sleep apnea (adult) (pediatric): Secondary | ICD-10-CM | POA: Diagnosis not present

## 2023-11-29 DIAGNOSIS — H353122 Nonexudative age-related macular degeneration, left eye, intermediate dry stage: Secondary | ICD-10-CM | POA: Diagnosis not present

## 2023-11-29 DIAGNOSIS — H353211 Exudative age-related macular degeneration, right eye, with active choroidal neovascularization: Secondary | ICD-10-CM | POA: Diagnosis not present

## 2023-11-29 DIAGNOSIS — H353221 Exudative age-related macular degeneration, left eye, with active choroidal neovascularization: Secondary | ICD-10-CM | POA: Diagnosis not present

## 2023-11-29 DIAGNOSIS — G4733 Obstructive sleep apnea (adult) (pediatric): Secondary | ICD-10-CM | POA: Diagnosis not present

## 2023-11-29 DIAGNOSIS — H353112 Nonexudative age-related macular degeneration, right eye, intermediate dry stage: Secondary | ICD-10-CM | POA: Diagnosis not present

## 2023-11-29 LAB — OPHTHALMOLOGY REPORT-SCANNED

## 2024-01-04 DIAGNOSIS — G4733 Obstructive sleep apnea (adult) (pediatric): Secondary | ICD-10-CM | POA: Diagnosis not present

## 2024-02-08 ENCOUNTER — Encounter: Payer: Self-pay | Admitting: Nurse Practitioner

## 2024-02-08 ENCOUNTER — Ambulatory Visit: Payer: Medicare HMO | Admitting: Nurse Practitioner

## 2024-02-08 VITALS — BP 122/80 | HR 78 | Ht 71.0 in | Wt 179.2 lb

## 2024-02-08 DIAGNOSIS — Z0000 Encounter for general adult medical examination without abnormal findings: Secondary | ICD-10-CM

## 2024-02-08 DIAGNOSIS — R739 Hyperglycemia, unspecified: Secondary | ICD-10-CM

## 2024-02-08 DIAGNOSIS — G4733 Obstructive sleep apnea (adult) (pediatric): Secondary | ICD-10-CM | POA: Diagnosis not present

## 2024-02-08 DIAGNOSIS — H353221 Exudative age-related macular degeneration, left eye, with active choroidal neovascularization: Secondary | ICD-10-CM

## 2024-02-08 DIAGNOSIS — E7800 Pure hypercholesterolemia, unspecified: Secondary | ICD-10-CM | POA: Diagnosis not present

## 2024-02-08 DIAGNOSIS — N400 Enlarged prostate without lower urinary tract symptoms: Secondary | ICD-10-CM | POA: Diagnosis not present

## 2024-02-08 DIAGNOSIS — K5909 Other constipation: Secondary | ICD-10-CM | POA: Diagnosis not present

## 2024-02-08 DIAGNOSIS — E538 Deficiency of other specified B group vitamins: Secondary | ICD-10-CM | POA: Diagnosis not present

## 2024-02-08 LAB — CBC WITH DIFFERENTIAL/PLATELET
Basophils Absolute: 0.1 x10E3/uL (ref 0.0–0.2)
Basos: 1 %
EOS (ABSOLUTE): 0.1 x10E3/uL (ref 0.0–0.4)
Eos: 3 %
Hematocrit: 43 % (ref 37.5–51.0)
Hemoglobin: 13.7 g/dL (ref 13.0–17.7)
Immature Grans (Abs): 0 x10E3/uL (ref 0.0–0.1)
Immature Granulocytes: 0 %
Lymphocytes Absolute: 1.4 x10E3/uL (ref 0.7–3.1)
Lymphs: 28 %
MCH: 30 pg (ref 26.6–33.0)
MCHC: 31.9 g/dL (ref 31.5–35.7)
MCV: 94 fL (ref 79–97)
Monocytes Absolute: 0.5 x10E3/uL (ref 0.1–0.9)
Monocytes: 10 %
Neutrophils Absolute: 2.8 x10E3/uL (ref 1.4–7.0)
Neutrophils: 58 %
Platelets: 338 x10E3/uL (ref 150–450)
RBC: 4.56 x10E6/uL (ref 4.14–5.80)
RDW: 14 % (ref 11.6–15.4)
WBC: 4.9 x10E3/uL (ref 3.4–10.8)

## 2024-02-08 LAB — CMP14+EGFR
ALT: 17 IU/L (ref 0–44)
AST: 26 IU/L (ref 0–40)
Albumin: 4.5 g/dL (ref 3.8–4.8)
Alkaline Phosphatase: 77 IU/L (ref 47–123)
BUN/Creatinine Ratio: 18 (ref 10–24)
BUN: 19 mg/dL (ref 8–27)
Bilirubin Total: 0.9 mg/dL (ref 0.0–1.2)
CO2: 25 mmol/L (ref 20–29)
Calcium: 10 mg/dL (ref 8.6–10.2)
Chloride: 101 mmol/L (ref 96–106)
Creatinine, Ser: 1.05 mg/dL (ref 0.76–1.27)
Globulin, Total: 2.7 g/dL (ref 1.5–4.5)
Glucose: 98 mg/dL (ref 70–99)
Potassium: 5.3 mmol/L — ABNORMAL HIGH (ref 3.5–5.2)
Sodium: 139 mmol/L (ref 134–144)
Total Protein: 7.2 g/dL (ref 6.0–8.5)
eGFR: 72 mL/min/1.73

## 2024-02-08 LAB — PSA TOTAL (REFLEX TO FREE): Prostate Specific Ag, Serum: 6.5 ng/mL — ABNORMAL HIGH (ref 0.0–4.0)

## 2024-02-08 LAB — HEMOGLOBIN A1C
Est. average glucose Bld gHb Est-mCnc: 111 mg/dL
Hgb A1c MFr Bld: 5.5 % (ref 4.8–5.6)

## 2024-02-08 LAB — LIPID PANEL
Chol/HDL Ratio: 2.8 ratio (ref 0.0–5.0)
Cholesterol, Total: 206 mg/dL — ABNORMAL HIGH (ref 100–199)
HDL: 73 mg/dL
LDL Chol Calc (NIH): 121 mg/dL — ABNORMAL HIGH (ref 0–99)
Triglycerides: 65 mg/dL (ref 0–149)
VLDL Cholesterol Cal: 12 mg/dL (ref 5–40)

## 2024-02-08 LAB — FPSA% REFLEX
% FREE PSA: 25.2 %
PSA, FREE: 1.64 ng/mL

## 2024-02-08 LAB — VITAMIN B12: Vitamin B-12: 1196 pg/mL (ref 232–1245)

## 2024-02-08 NOTE — Patient Instructions (Addendum)
 Constipation: I recommend increasing your water intake to at least 64 ounces of water a day. Increase your fiber intake to 30g a day. You can also use Miralax (1/2-1 capful is usually enough, but you adjust based on your bowels) to help with bowel movements. This helps to soften the stool and make it easier to pass.   Let me know if you notice any worsening or changing facial strength.   Be cautious for the flu- it is very aggressive this year. Typical symptoms start out with a sore throat and feeling achy and quickly progress to fevers, chills, fatigue, body aches, and cough. If you start to have symptoms send me a message Joseph Moses and we can send in Tamiflu.     Joseph Moses,  Thank you for taking the time for your Medicare Wellness Visit. I appreciate your continued commitment to your health goals. Please review the care plan we discussed, and feel free to reach out if I can assist you further.  Please note that Annual Wellness Visits do not include a physical exam. Some assessments may be limited, especially if the visit was conducted virtually. If needed, we may recommend an in-person follow-up with your provider.  Ongoing Care Seeing your primary care provider every 3 to 6 months helps us  monitor your health and provide consistent, personalized care.   Referrals If a referral was made during today's visit and you haven't received any updates within two weeks, please contact the referred provider directly to check on the status.  Recommended Screenings:  Health Maintenance  Topic Date Due   Zoster (Shingles) Vaccine (2 of 2) 06/21/2017   COVID-19 Vaccine (4 - 2025-26 season) 09/25/2023   Medicare Annual Wellness Visit  01/30/2024   DTaP/Tdap/Td vaccine (4 - Td or Tdap) 03/31/2026   Pneumococcal Vaccine for age over 72  Completed   Flu Shot  Completed   Meningitis B Vaccine  Aged Out   Colon Cancer Screening  Discontinued   Hepatitis C Screening  Discontinued       02/08/2024     8:14 AM  Advanced Directives  Does Patient Have a Medical Advance Directive? Yes  Type of Advance Directive Living will;Healthcare Power of Attorney  Copy of Healthcare Power of Attorney in Chart? No - copy requested    Vision: Annual vision screenings are recommended for Joseph Moses detection of glaucoma, cataracts, and diabetic retinopathy. These exams can also reveal signs of chronic conditions such as diabetes and high blood pressure.  Dental: Annual dental screenings help detect Joseph Moses signs of oral cancer, gum disease, and other conditions linked to overall health, including heart disease and diabetes.  Please see the attached documents for additional preventive care recommendations.

## 2024-02-08 NOTE — Progress Notes (Signed)
 "  Chief Complaint  Patient presents with   Medicare Wellness    MWV/CPE/fasting labs, pt. Will think about getting covid vaccine today,      Subjective:   Joseph Moses is a 79 y.o. male who presents for a Medicare Annual Wellness Visit.  History of Present Illness Joseph Moses is a 79 year old male who presents for his Medicare annual wellness exam.  He has experienced a gradual worsening of hearing and vision. He is under the care of a retina specialist and receives Eylea  HD injections every 10 to 12 weeks. Despite treatment, there is a gradual decline in vision, impacting his ability to text on his phone.  He reports worsening neuropathy and is curious about the effectiveness of red light therapy boots, which he has not tried due to cost considerations.  He has been dealing with constipation for the past three to four months. His diet includes granola, yogurt, and salads, but he acknowledges inadequate hydration. He has previously used Miralax.  He mentions a weakening sensation on the right side of his face, occasionally resulting in drooling, which has been occurring for a couple of months. No associated pain, choking, or changes in chewing.  He experiences nocturnal toe redness and discomfort, which sometimes wakes him up at night. He takes magnesium, which helps with cramps. Previous vascular evaluations showed no circulatory issues.  He uses a CPAP machine nightly for sleep apnea, with apneas reduced to less than five per year. He notes being an open-mouth sleeper, which causes mask leakage.  He maintains physical activity by walking at the gym three to four days a week and doing yard work on other days. He walks at a pace of three and a half miles per hour. No shortness of breath, palpitations, or cough reported. He has existing scar tissue from a previous discectomy but has not noticed any new fullness in the throat. He has redness in the toes but does not report  significant swelling in the ankles. Visit info / Clinical Intake: Medicare Wellness Visit Type:: Subsequent Annual Wellness Visit Persons participating in visit and providing information:: patient Medicare Wellness Visit Mode:: In-person (required for WT[facility]) Interpreter Needed?: No Pre-visit prep was completed: yes AWV questionnaire completed by patient prior to visit?: yes Living arrangements:: lives with spouse/significant other Patient's Overall Health Status Rating: good Typical amount of pain: none Does pain affect daily life?: no Are you currently prescribed opioids?: no  Dietary Habits and Nutritional Risks How many meals a day?: 3 Eats fruit and vegetables daily?: yes Most meals are obtained by: preparing own meals In the last 2 weeks, have you had any of the following?: none Diabetic:: no  Functional Status Ambulation: Independent Medication Administration: Independent Home Management (perform basic housework or laundry): Independent Manage your own finances?: yes Primary transportation is: driving Concerns about vision?: (!) yes (has retina issues) Concerns about hearing?: (!) yes Uses hearing aids?: no Hear whispered voice?: yes  Fall Screening Falls in the past year?: 0 Number of falls in past year: 0 Was there an injury with Fall?: 0 Fall Risk Category Calculator: 0 Patient Fall Risk Level: Low Fall Risk  Fall Risk Patient at Risk for Falls Due to: No Fall Risks Fall risk Follow up: Falls evaluation completed  Home and Transportation Safety: All rugs have non-skid backing?: yes All stairs or steps have railings?: yes Grab bars in the bathtub or shower?: yes Have non-skid surface in bathtub or shower?: (!) no Good home  lighting?: yes Regular seat belt use?: yes Hospital stays in the last year:: no  Cognitive Assessment Difficulty concentrating, remembering, or making decisions? : no Will 6CIT or Mini Cog be Completed: no 6CIT or Mini Cog Declined:  patient declined  Advance Directives (For Healthcare) Does Patient Have a Medical Advance Directive?: Yes Type of Advance Directive: Living will; Healthcare Power of Attorney Copy of Healthcare Power of Attorney in Chart?: No - copy requested Copy of Living Will in Chart?: No - copy requested    Allergies (verified) Patient has no known allergies.   Current Medications (verified) Outpatient Encounter Medications as of 02/08/2024  Medication Sig   Magnesium 250 MG TABS Take 1 tablet by mouth daily.   vitamin B-12 (CYANOCOBALAMIN) 500 MCG tablet Take 500 mcg by mouth daily.   [DISCONTINUED] mupirocin  ointment (BACTROBAN ) 2 % Apply to wounds twice a day for 7 days. (Patient not taking: Reported on 02/08/2024)   [DISCONTINUED] tiZANidine  (ZANAFLEX ) 4 MG tablet TAKE ONE TABLET BY MOUTH EVERY 6 HOURS AS NEEDED FOR MUSCLE SPASMS (Patient not taking: Reported on 02/08/2024)   No facility-administered encounter medications on file as of 02/08/2024.    History: Past Medical History:  Diagnosis Date   Acute MEE (middle ear effusion), right 04/18/2022   Diarrhea 01/12/2022   Impacted cerumen of left ear 04/18/2022   Past Surgical History:  Procedure Laterality Date   CERVICAL DISCECTOMY  2001 &2005   NASAL SEPTUM SURGERY  1968   History reviewed. No pertinent family history. Social History   Occupational History   Not on file  Tobacco Use   Smoking status: Never   Smokeless tobacco: Never  Vaping Use   Vaping status: Never Used  Substance and Sexual Activity   Alcohol use: Yes    Alcohol/week: 1.0 standard drink of alcohol    Types: 1 Cans of beer per week   Drug use: No   Sexual activity: Not on file   Tobacco Counseling Counseling given: Not Answered  SDOH Screenings   Food Insecurity: No Food Insecurity (02/08/2024)  Housing: Low Risk (02/08/2024)  Transportation Needs: No Transportation Needs (02/08/2024)  Utilities: Not At Risk (02/08/2024)  Alcohol Screen: Low Risk  (10/01/2021)  Depression (PHQ2-9): Low Risk (02/08/2024)  Financial Resource Strain: Low Risk (10/01/2021)  Physical Activity: Sufficiently Active (02/08/2024)  Social Connections: Moderately Integrated (02/08/2024)  Stress: No Stress Concern Present (02/08/2024)  Tobacco Use: Low Risk (02/08/2024)  Health Literacy: Adequate Health Literacy (02/08/2024)   See flowsheets for full screening details  Depression Screen PHQ 2 & 9 Depression Scale- Over the past 2 weeks, how often have you been bothered by any of the following problems? Little interest or pleasure in doing things: 0 Feeling down, depressed, or hopeless (PHQ Adolescent also includes...irritable): 0 PHQ-2 Total Score: 0     Goals Addressed               This Visit's Progress     Patient Stated (pt-stated)        Maintain current level of health until next exam.         Physical Exam Vitals and nursing note reviewed.  Constitutional:      Appearance: Normal appearance.  HENT:     Head: Normocephalic and atraumatic.     Right Ear: Hearing, tympanic membrane, ear canal and external ear normal.     Left Ear: Hearing, tympanic membrane, ear canal and external ear normal.     Nose: Nose normal.     Right  Sinus: No maxillary sinus tenderness or frontal sinus tenderness.     Left Sinus: No maxillary sinus tenderness or frontal sinus tenderness.     Mouth/Throat:     Lips: Pink.     Mouth: Mucous membranes are moist.     Pharynx: Oropharynx is clear.  Eyes:     General: Lids are normal. Vision grossly intact.     Extraocular Movements: Extraocular movements intact.     Pupils: Pupils are equal, round, and reactive to light.     Funduscopic exam:    Right eye: No hemorrhage. Red reflex present.        Left eye: No hemorrhage. Red reflex present.    Visual Fields: Right eye visual fields normal and left eye visual fields normal.  Neck:     Thyroid: No thyromegaly.     Vascular: No carotid bruit or JVD.  Cardiovascular:      Rate and Rhythm: Normal rate and regular rhythm.     Chest Wall: PMI is not displaced.     Pulses: Normal pulses.          Dorsalis pedis pulses are 2+ on the right side and 2+ on the left side.       Posterior tibial pulses are 2+ on the right side and 2+ on the left side.     Heart sounds: Normal heart sounds. No murmur heard. Pulmonary:     Effort: Pulmonary effort is normal. No respiratory distress.     Breath sounds: Normal breath sounds.  Chest:  Breasts:    Breasts are symmetrical.  Abdominal:     General: Bowel sounds are normal. There is no distension or abdominal bruit.     Palpations: Abdomen is soft. There is no hepatomegaly, splenomegaly or mass.     Tenderness: There is no abdominal tenderness. There is no right CVA tenderness, left CVA tenderness, guarding or rebound.  Musculoskeletal:        General: Normal range of motion.     Cervical back: Full passive range of motion without pain and neck supple. No tenderness. No spinous process tenderness or muscular tenderness.     Right lower leg: No edema.     Left lower leg: No edema.  Feet:     Right foot:     Toenail Condition: Right toenails are normal.     Left foot:     Toenail Condition: Left toenails are normal.  Lymphadenopathy:     Cervical: No cervical adenopathy.     Upper Body:     Right upper body: No supraclavicular adenopathy.     Left upper body: No supraclavicular adenopathy.  Skin:    General: Skin is warm and dry.     Capillary Refill: Capillary refill takes less than 2 seconds.     Nails: There is no clubbing.  Neurological:     General: No focal deficit present.     Mental Status: He is alert and oriented to person, place, and time.     Cranial Nerves: No cranial nerve deficit.     Sensory: Sensation is intact. No sensory deficit.     Motor: Motor function is intact. No weakness.     Coordination: Coordination is intact. Coordination normal.     Gait: Gait is intact. Gait normal.   Psychiatric:        Attention and Perception: Attention normal.        Mood and Affect: Mood normal.  Speech: Speech normal.        Behavior: Behavior normal. Behavior is cooperative.        Cognition and Memory: Cognition and memory normal.         Objective:    Today's Vitals   02/08/24 0818  BP: 122/80  Pulse: 78  Weight: 179 lb 3.2 oz (81.3 kg)  Height: 5' 11 (1.803 m)   Body mass index is 24.99 kg/m.  Hearing/Vision screen No results found. Immunizations and Health Maintenance Health Maintenance  Topic Date Due   COVID-19 Vaccine (4 - 2025-26 season) 02/24/2024 (Originally 09/25/2023)   Zoster Vaccines- Shingrix (2 of 2) 05/08/2024 (Originally 06/21/2017)   Medicare Annual Wellness (AWV)  02/07/2025   DTaP/Tdap/Td (4 - Td or Tdap) 03/31/2026   Pneumococcal Vaccine: 50+ Years  Completed   Influenza Vaccine  Completed   Meningococcal B Vaccine  Aged Out   Colonoscopy  Discontinued   Hepatitis C Screening  Discontinued        Assessment/Plan:  This is a routine wellness examination for Joseph Moses.  Patient Care Team: Tami Blass, Camie BRAVO, NP as PCP - General (Nurse Practitioner) Porter Andrez SAUNDERS, PA-C (Inactive) as Physician Assistant (Dermatology) Elner Arley LABOR, MD as Consulting Physician (Ophthalmology)  I have personally reviewed and noted the following in the patients chart:   Medical and social history Use of alcohol, tobacco or illicit drugs  Current medications and supplements including opioid prescriptions. Functional ability and status Nutritional status Physical activity Advanced directives List of other physicians Hospitalizations, surgeries, and ER visits in previous 12 months Vitals Screenings to include cognitive, depression, and falls Referrals and appointments  Orders Placed This Encounter  Procedures   CBC with Differential/Platelet   CMP14+EGFR   Hemoglobin A1c   Lipid panel   PSA Total (Reflex To Free)   Vitamin B12   %fPSA  Reflex   In addition, I have reviewed and discussed with patient certain preventive protocols, quality metrics, and best practice recommendations. A written personalized care plan for preventive services as well as general preventive health recommendations were provided to patient.   Camie CHARLENA Doing, NP   02/16/2024   Return in about 1 year (around 02/07/2025) for AWV + CPE.  After Visit Summary: (In Person-Printed) AVS printed and given to the patient  Nurse Notes:  "

## 2024-02-14 ENCOUNTER — Ambulatory Visit: Payer: Self-pay | Admitting: Nurse Practitioner

## 2024-02-16 DIAGNOSIS — K5909 Other constipation: Secondary | ICD-10-CM | POA: Insufficient documentation

## 2024-02-16 NOTE — Assessment & Plan Note (Signed)
 Obstructive sleep apnea managed with CPAP. Reports good control with apneas reduced to less than five per year. Experiences mouth breathing and mask leakage but maintains effective treatment. - Continue CPAP therapy

## 2024-02-16 NOTE — Assessment & Plan Note (Signed)
 Annual wellness exam conducted. Overall health is good with no significant changes. Engages in regular physical activity and maintains a healthy lifestyle. - Continue regular physical activity and healthy lifestyle

## 2024-02-16 NOTE — Assessment & Plan Note (Signed)
 Reports urgency and frequent urination. No recent PSA test conducted. - Ordered PSA test

## 2024-02-16 NOTE — Assessment & Plan Note (Signed)
 Reports constipation over the last 3-4 months. Current diet includes granola and salads but lacks adequate fiber and hydration. - Increase hydration - Increase fiber intake with options like Benefiber or Fibercon - Consider Miralax if fiber and hydration are insufficient

## 2024-02-16 NOTE — Assessment & Plan Note (Signed)
 Gradual worsening of vision in the left eye due to exudative age-related macular degeneration. Under treatment with Eylea  injections every 10-12 weeks. Prognosis includes potential for continued vision loss but not necessarily complete blindness. - Continue Eylea  injections every 10-12 weeks

## 2024-02-22 LAB — OPHTHALMOLOGY REPORT-SCANNED

## 2025-02-18 ENCOUNTER — Encounter: Admitting: Nurse Practitioner
